# Patient Record
Sex: Male | Born: 1960
Health system: Southern US, Community
[De-identification: ages and names within clinical notes are randomized; demographics above are authoritative.]

## PROBLEM LIST (undated history)

## (undated) DIAGNOSIS — H9193 Unspecified hearing loss, bilateral: Secondary | ICD-10-CM

## (undated) DIAGNOSIS — G809 Cerebral palsy, unspecified: Secondary | ICD-10-CM

## (undated) DIAGNOSIS — M1990 Unspecified osteoarthritis, unspecified site: Secondary | ICD-10-CM

## (undated) DIAGNOSIS — I10 Essential (primary) hypertension: Secondary | ICD-10-CM

## (undated) DIAGNOSIS — M2040 Other hammer toe(s) (acquired), unspecified foot: Secondary | ICD-10-CM

## (undated) DIAGNOSIS — K219 Gastro-esophageal reflux disease without esophagitis: Secondary | ICD-10-CM

## (undated) DIAGNOSIS — M25462 Effusion, left knee: Secondary | ICD-10-CM

## (undated) HISTORY — DX: Unspecified osteoarthritis, unspecified site: M19.90

## (undated) HISTORY — PX: CIRCUMCISION: SUR203

## (undated) HISTORY — DX: Gastro-esophageal reflux disease without esophagitis: K21.9

## (undated) HISTORY — DX: Effusion, left knee: M25.462

## (undated) HISTORY — DX: Other hammer toe(s) (acquired), unspecified foot: M20.40

## (undated) HISTORY — DX: Cerebral palsy, unspecified: G80.9

## (undated) HISTORY — DX: Essential (primary) hypertension: I10

## (undated) HISTORY — PX: FOOT SURGERY: SHX648

## (undated) HISTORY — DX: Unspecified hearing loss, bilateral: H91.93

---

## 2005-01-26 ENCOUNTER — Ambulatory Visit: Payer: Self-pay | Admitting: Internal Medicine

## 2005-07-16 ENCOUNTER — Ambulatory Visit: Payer: Self-pay | Admitting: Internal Medicine

## 2005-12-01 ENCOUNTER — Ambulatory Visit: Payer: Self-pay | Admitting: Nurse Practitioner

## 2006-06-07 ENCOUNTER — Ambulatory Visit: Payer: Self-pay | Admitting: Internal Medicine

## 2006-10-12 ENCOUNTER — Ambulatory Visit: Payer: Self-pay | Admitting: Internal Medicine

## 2006-11-16 ENCOUNTER — Ambulatory Visit: Payer: Self-pay | Admitting: Internal Medicine

## 2007-09-14 ENCOUNTER — Ambulatory Visit: Payer: Self-pay | Admitting: Internal Medicine

## 2007-09-29 ENCOUNTER — Emergency Department (HOSPITAL_COMMUNITY): Admission: EM | Admit: 2007-09-29 | Discharge: 2007-09-29 | Payer: Self-pay | Admitting: Family Medicine

## 2007-10-21 ENCOUNTER — Ambulatory Visit: Payer: Self-pay | Admitting: Internal Medicine

## 2007-11-10 ENCOUNTER — Ambulatory Visit: Payer: Self-pay | Admitting: Internal Medicine

## 2008-04-05 ENCOUNTER — Ambulatory Visit: Payer: Self-pay | Admitting: Internal Medicine

## 2008-04-05 LAB — CONVERTED CEMR LAB
Basophils Relative: 0 % (ref 0–1)
Calcium: 9.6 mg/dL (ref 8.4–10.5)
Chloride: 103 meq/L (ref 96–112)
Cholesterol: 204 mg/dL — ABNORMAL HIGH (ref 0–200)
Creatinine, Ser: 0.8 mg/dL (ref 0.40–1.50)
Glucose, Bld: 76 mg/dL (ref 70–99)
HCT: 49.4 % (ref 39.0–52.0)
HDL: 47 mg/dL (ref >39)
MCHC: 33.4 g/dL (ref 30.0–36.0)
MCV: 96.1 fL (ref 78.0–100.0)
Monocytes Relative: 7 % (ref 3–12)
Neutro Abs: 8.5 10*3/uL — ABNORMAL HIGH (ref 1.7–7.7)
Neutrophils Relative %: 65 % (ref 43–77)
Platelets: 259 10*3/uL (ref 150–400)
Potassium: 3.8 meq/L (ref 3.5–5.3)
Total CHOL/HDL Ratio: 4.3
VLDL: 38 mg/dL (ref 0–40)

## 2009-04-25 ENCOUNTER — Ambulatory Visit: Payer: Self-pay | Admitting: Internal Medicine

## 2009-04-25 LAB — CONVERTED CEMR LAB
ALT: 19 units/L (ref 0–53)
AST: 18 units/L (ref 0–37)
Albumin: 4.5 g/dL (ref 3.5–5.2)
Alkaline Phosphatase: 83 units/L (ref 39–117)
Calcium: 9.9 mg/dL (ref 8.4–10.5)
Cholesterol: 200 mg/dL (ref 0–200)
Glucose, Bld: 82 mg/dL (ref 70–99)
Potassium: 4 meq/L (ref 3.5–5.3)
Triglycerides: 118 mg/dL (ref <150)

## 2009-09-24 ENCOUNTER — Ambulatory Visit: Payer: Self-pay | Admitting: Family Medicine

## 2010-01-28 ENCOUNTER — Ambulatory Visit: Payer: Self-pay | Admitting: Family Medicine

## 2010-02-25 ENCOUNTER — Ambulatory Visit: Payer: Self-pay | Admitting: Internal Medicine

## 2010-02-25 ENCOUNTER — Ambulatory Visit (HOSPITAL_COMMUNITY): Admission: RE | Admit: 2010-02-25 | Discharge: 2010-02-25 | Payer: Self-pay | Admitting: Internal Medicine

## 2010-02-25 LAB — CONVERTED CEMR LAB
AST: 20 units/L (ref 0–37)
Albumin: 4.3 g/dL (ref 3.5–5.2)
BUN: 16 mg/dL (ref 6–23)
CO2: 24 meq/L (ref 19–32)
Chloride: 103 meq/L (ref 96–112)
Creatinine, Ser: 0.9 mg/dL (ref 0.40–1.50)
PSA: 1.9 ng/mL (ref 0.10–4.00)
Total Bilirubin: 1 mg/dL (ref 0.3–1.2)
Total Protein: 7.5 g/dL (ref 6.0–8.3)

## 2010-03-24 ENCOUNTER — Ambulatory Visit: Payer: Self-pay | Admitting: Internal Medicine

## 2011-04-21 ENCOUNTER — Ambulatory Visit (HOSPITAL_COMMUNITY)
Admission: RE | Admit: 2011-04-21 | Discharge: 2011-04-21 | Disposition: A | Discharge status: Home / Self Care | Payer: Medicaid Other | Source: Ambulatory Visit | Attending: Family Medicine | Admitting: Family Medicine

## 2011-04-21 ENCOUNTER — Other Ambulatory Visit (HOSPITAL_COMMUNITY): Payer: Self-pay | Admitting: Family Medicine

## 2011-04-21 DIAGNOSIS — M25469 Effusion, unspecified knee: Secondary | ICD-10-CM

## 2011-04-21 DIAGNOSIS — M25569 Pain in unspecified knee: Secondary | ICD-10-CM | POA: Insufficient documentation

## 2011-04-21 DIAGNOSIS — R609 Edema, unspecified: Secondary | ICD-10-CM | POA: Insufficient documentation

## 2011-05-01 HISTORY — PX: COLONOSCOPY: SHX174

## 2011-05-05 ENCOUNTER — Ambulatory Visit (AMBULATORY_SURGERY_CENTER): Payer: Medicaid Other

## 2011-05-05 VITALS — Ht 71.0 in | Wt 190.2 lb

## 2011-05-05 DIAGNOSIS — Z1211 Encounter for screening for malignant neoplasm of colon: Secondary | ICD-10-CM

## 2011-05-05 MED ORDER — PEG-KCL-NACL-NASULF-NA ASC-C 100 G PO SOLR: 1.0000 | Freq: Once | ORAL | Status: AC

## 2011-05-19 ENCOUNTER — Encounter: Payer: Self-pay | Admitting: Internal Medicine

## 2011-05-19 ENCOUNTER — Ambulatory Visit (AMBULATORY_SURGERY_CENTER): Payer: Medicaid Other | Admitting: Internal Medicine

## 2011-05-19 VITALS — BP 128/65 | HR 97 | Temp 98.3°F | Resp 23 | Ht 70.0 in | Wt 193.0 lb

## 2011-05-19 DIAGNOSIS — D126 Benign neoplasm of colon, unspecified: Secondary | ICD-10-CM

## 2011-05-19 DIAGNOSIS — K5730 Diverticulosis of large intestine without perforation or abscess without bleeding: Secondary | ICD-10-CM

## 2011-05-19 DIAGNOSIS — K5289 Other specified noninfective gastroenteritis and colitis: Secondary | ICD-10-CM

## 2011-05-19 DIAGNOSIS — Z1211 Encounter for screening for malignant neoplasm of colon: Secondary | ICD-10-CM

## 2011-05-19 MED ORDER — SODIUM CHLORIDE 0.9 % IV SOLN: 500.0000 mL | INTRAVENOUS | Status: DC

## 2011-05-19 NOTE — Patient Instructions (Signed)
Please review discharge instructions Take 1-2 Teaspoons of Metamucil daily

## 2011-05-19 NOTE — Progress Notes (Signed)
Pt tolerated the exam very well. MAW  Hung second bag of ns . 13:50 MAW

## 2011-05-20 ENCOUNTER — Telehealth: Payer: Self-pay | Admitting: *Deleted

## 2011-05-20 NOTE — Telephone Encounter (Signed)

## 2011-05-25 ENCOUNTER — Encounter: Payer: Self-pay | Admitting: Internal Medicine

## 2011-06-01 ENCOUNTER — Encounter: Payer: Self-pay | Admitting: *Deleted

## 2011-07-01 ENCOUNTER — Ambulatory Visit: Payer: Medicare Other | Attending: Family Medicine | Admitting: Physical Therapy

## 2011-07-01 DIAGNOSIS — R293 Abnormal posture: Secondary | ICD-10-CM | POA: Insufficient documentation

## 2011-07-01 DIAGNOSIS — R269 Unspecified abnormalities of gait and mobility: Secondary | ICD-10-CM | POA: Insufficient documentation

## 2011-07-01 DIAGNOSIS — M25569 Pain in unspecified knee: Secondary | ICD-10-CM | POA: Insufficient documentation

## 2011-07-01 DIAGNOSIS — IMO0001 Reserved for inherently not codable concepts without codable children: Secondary | ICD-10-CM | POA: Insufficient documentation

## 2011-07-14 ENCOUNTER — Ambulatory Visit: Payer: Medicare Other | Admitting: Physical Therapy

## 2011-07-16 ENCOUNTER — Ambulatory Visit: Payer: Medicare Other | Admitting: Physical Therapy

## 2011-07-21 ENCOUNTER — Ambulatory Visit: Payer: Medicare Other | Admitting: Physical Therapy

## 2011-07-23 ENCOUNTER — Encounter: Payer: Medicare Other | Admitting: Physical Therapy

## 2011-07-30 ENCOUNTER — Ambulatory Visit: Payer: Medicare Other | Admitting: Physical Therapy

## 2013-03-14 ENCOUNTER — Emergency Department (HOSPITAL_COMMUNITY)
Admission: EM | Admit: 2013-03-14 | Discharge: 2013-03-14 | Disposition: A | Discharge status: Home / Self Care | Payer: Medicare Other | Attending: Emergency Medicine | Admitting: Emergency Medicine

## 2013-03-14 ENCOUNTER — Encounter (HOSPITAL_COMMUNITY): Payer: Self-pay | Admitting: *Deleted

## 2013-03-14 DIAGNOSIS — R112 Nausea with vomiting, unspecified: Secondary | ICD-10-CM | POA: Insufficient documentation

## 2013-03-14 DIAGNOSIS — M129 Arthropathy, unspecified: Secondary | ICD-10-CM | POA: Insufficient documentation

## 2013-03-14 DIAGNOSIS — F17200 Nicotine dependence, unspecified, uncomplicated: Secondary | ICD-10-CM | POA: Insufficient documentation

## 2013-03-14 DIAGNOSIS — R1110 Vomiting, unspecified: Secondary | ICD-10-CM

## 2013-03-14 DIAGNOSIS — Z8669 Personal history of other diseases of the nervous system and sense organs: Secondary | ICD-10-CM | POA: Insufficient documentation

## 2013-03-14 DIAGNOSIS — Z8739 Personal history of other diseases of the musculoskeletal system and connective tissue: Secondary | ICD-10-CM | POA: Insufficient documentation

## 2013-03-14 DIAGNOSIS — H9190 Unspecified hearing loss, unspecified ear: Secondary | ICD-10-CM | POA: Insufficient documentation

## 2013-03-14 DIAGNOSIS — I10 Essential (primary) hypertension: Secondary | ICD-10-CM | POA: Insufficient documentation

## 2013-03-14 DIAGNOSIS — R197 Diarrhea, unspecified: Secondary | ICD-10-CM | POA: Insufficient documentation

## 2013-03-14 DIAGNOSIS — Z79899 Other long term (current) drug therapy: Secondary | ICD-10-CM | POA: Insufficient documentation

## 2013-03-14 LAB — CBC WITH DIFFERENTIAL/PLATELET
Basophils Absolute: 0 10*3/uL (ref 0.0–0.1)
Basophils Relative: 0 % (ref 0–1)
Eosinophils Absolute: 0.2 10*3/uL (ref 0.0–0.7)
HCT: 47.8 % (ref 39.0–52.0)
Monocytes Absolute: 1.2 10*3/uL — ABNORMAL HIGH (ref 0.1–1.0)
Neutro Abs: 5 10*3/uL (ref 1.7–7.7)
Neutrophils Relative %: 54 % (ref 43–77)
Platelets: 219 10*3/uL (ref 150–400)
RBC: 5.11 MIL/uL (ref 4.22–5.81)
RDW: 12.7 % (ref 11.5–15.5)
WBC: 9.2 10*3/uL (ref 4.0–10.5)

## 2013-03-14 LAB — COMPREHENSIVE METABOLIC PANEL
AST: 28 U/L (ref 0–37)
BUN: 12 mg/dL (ref 6–23)
CO2: 27 mEq/L (ref 19–32)
GFR calc Af Amer: >90 mL/min (ref >90)
Glucose, Bld: 88 mg/dL (ref 70–99)
Sodium: 139 mEq/L (ref 135–145)

## 2013-03-14 MED ORDER — SODIUM CHLORIDE 0.9 % IV BOLUS (SEPSIS)
1000.0000 mL | Freq: Once | INTRAVENOUS | Status: AC
  Administered 2013-03-14: 1000 mL via INTRAVENOUS

## 2013-03-14 MED ORDER — ONDANSETRON HCL 4 MG/2ML IJ SOLN
4.0000 mg | Freq: Once | INTRAMUSCULAR | Status: AC
  Administered 2013-03-14: 4 mg via INTRAVENOUS
  Filled 2013-03-14: qty 2

## 2013-03-14 NOTE — ED Notes (Signed)
The pt has had vomiting and lower back pain for 3 days with intermittent diarrhea

## 2013-03-14 NOTE — ED Provider Notes (Signed)
I saw and evaluated the patient, reviewed the resident's note and I agree with the findings and plan.  No vomiting today but vomited the prior 2 days, he has had loose stools several times a day for the last 2 days and a few loose stools today nonbloody, he has had intermittent mild migratory abdominal pain but no abdominal pain or tenderness now in ED.  Hurman Horn, MD 03/17/13 [phone]

## 2013-03-14 NOTE — ED Provider Notes (Signed)
History     CSN: [id number]  Arrival date & time 03/14/13  1927   First MD Initiated Contact with Patient 03/14/13 1948      Chief Complaint  Patient presents with  . Emesis    (Consider location/radiation/quality/duration/timing/severity/associated sxs/prior treatment) Patient is a 52 y.o. male presenting with vomiting. The history is provided by the patient.  Emesis Severity:  Mild Duration:  2 days Timing:  Intermittent Number of daily episodes:  2 Quality:  Stomach contents Able to tolerate:  Liquids Progression:  Improving Chronicity:  New Recent urination:  Decreased Context: not post-tussive and not self-induced   Associated symptoms: diarrhea   Associated symptoms: no abdominal pain, no chills and no headaches   Risk factors: suspect food intake   Risk factors: no alcohol use and no sick contacts     Past Medical History  Diagnosis Date  . Hypertension   . Hammer toe     right  . Hearing loss of both ears     Pt has bilateral hearing aids  . Swollen L knee   . Arthritis   . Cerebral palsy     Past Surgical History  Procedure Laterality Date  . Foot surgery      due to CP    Family History  Problem Relation Age of Onset  . Prostate cancer Father     History  Substance Use Topics  . Smoking status: Current Every Day Smoker -- 1.00 packs/day    Types: Cigarettes  . Smokeless tobacco: Never Used  . Alcohol Use: 1.5 oz/week    3 drink(s) per week      Review of Systems  Constitutional: Negative for fever, chills, diaphoresis, activity change and appetite change.  HENT: Negative for neck pain.   Respiratory: Negative for cough, chest tightness, shortness of breath and wheezing.   Cardiovascular: Negative for chest pain and palpitations.  Gastrointestinal: Positive for nausea, vomiting and diarrhea. Negative for abdominal pain and constipation.  Genitourinary: Negative for frequency and difficulty urinating.  Skin: Negative for rash and  wound.  Neurological: Negative for dizziness, syncope, weakness, light-headedness, numbness and headaches.  All other systems reviewed and are negative.    Allergies  Review of patient's allergies indicates no known allergies.  Home Medications   Current Outpatient Rx  Name  Route  Sig  Dispense  Refill  . hydrochlorothiazide 25 MG tablet   Oral   Take 25 mg by mouth daily.           Marland Kitchen lisinopril (PRINIVIL,ZESTRIL) 20 MG tablet   Oral   Take 20 mg by mouth daily.           . naproxen (NAPROSYN) 500 MG tablet   Oral   Take 500 mg by mouth 2 (two) times daily with a meal.           . omeprazole (PRILOSEC) 20 MG capsule   Oral   Take 20 mg by mouth daily. Take 2 capsules daily            BP 141/87  Pulse 99  Temp(Src) 98.6 F (37 C) (Oral)  Resp 18  SpO2 98%  Physical Exam  Nursing note and vitals reviewed. Constitutional: He appears well-developed and well-nourished.  HENT:  Head: Normocephalic and atraumatic.  Right Ear: External ear normal.  Left Ear: External ear normal.  Nose: Nose normal.  Mouth/Throat: Oropharynx is clear and moist. No oropharyngeal exudate.  Eyes: Conjunctivae are normal. Pupils are equal, round, and reactive  to light.  Neck: Normal range of motion. Neck supple.  Cardiovascular: Normal rate, regular rhythm, normal heart sounds and intact distal pulses.   Pulmonary/Chest: Effort normal and breath sounds normal. No respiratory distress. He has no wheezes. He has no rales. He exhibits no tenderness.  Abdominal: Soft. Bowel sounds are normal. He exhibits no distension and no mass. There is no tenderness. There is no rebound and no guarding.  Musculoskeletal: Normal range of motion. He exhibits no edema and no tenderness.  Neurological: He is alert. He displays normal reflexes. No cranial nerve deficit. He exhibits normal muscle tone. Coordination normal.  Skin: Skin is warm and dry. No rash noted. No erythema. No pallor.  Psychiatric:  He has a normal mood and affect. His behavior is normal. Judgment and thought content normal.    ED Course  Procedures (including critical care time)  Labs Reviewed  CBC WITH DIFFERENTIAL - Abnormal; Notable for the following:    Monocytes Relative 13 (*)    Monocytes Absolute 1.2 (*)    All other components within normal limits  COMPREHENSIVE METABOLIC PANEL  LIPASE, BLOOD  URINALYSIS, ROUTINE W REFLEX MICROSCOPIC  URINALYSIS, ROUTINE W REFLEX MICROSCOPIC   No results found.   1. Vomiting   2. Diarrhea       MDM  53 yo M presents for 2 days of non-bloody/non-bilious emesis and non-bloody diarrhea with occasional migratory cramping abdominal pain. AFVSS. Abdomen soft and non-tender on palpation. Clinical picture not concerning for testicular torsion, acute appendicitis, acute cholecystitis, acute pancreatitis, AAA, or obstruction. No clinical evidence of dehydration. Pt tolerated PO intake in ED and counseled on BRAT diet. Patient given return precautions, including worsening of signs or symptoms. Patient instructed to follow-up with primary care physician.           Clemetine Marker, MD 03/14/13 [phone]

## 2013-03-14 NOTE — ED Notes (Signed)
Patient given discharge instructions for vomiting and diarrhea. No rx was provided. Advised to follow up with primary care in two days if not feeling better, went over [employer]. Patient voiced understanding of all instructions and had no further questions. Patient ambulated to front lobby without difficulty

## 2013-03-14 NOTE — ED Notes (Signed)
Patient states he began vomiting on Sunday, having nausea, some diarrhea. States no vomiting or diarrhea since yesterday. Also experiencing lower back pain, began on Sunday. No appetite. Noticed that his urine is dark in color, denies any trouble with urination. Plan of care discussed with patient. Call light at bedside. Will continue to monitor.

## 2013-03-14 NOTE — ED Notes (Signed)
Patient given urine cup, states he is unable to urinate at this time. Asking for water, informed patient we are going to wait until the doctor sees patient.

## 2013-03-17 ENCOUNTER — Ambulatory Visit: Payer: Medicare Other

## 2013-03-17 ENCOUNTER — Ambulatory Visit (INDEPENDENT_AMBULATORY_CARE_PROVIDER_SITE_OTHER): Payer: Medicare Other | Admitting: Family Medicine

## 2013-03-17 VITALS — BP 110/70 | HR 86 | Temp 99.0°F | Resp 16 | Ht 68.0 in | Wt 182.0 lb

## 2013-03-17 DIAGNOSIS — R109 Unspecified abdominal pain: Secondary | ICD-10-CM

## 2013-03-17 DIAGNOSIS — K5289 Other specified noninfective gastroenteritis and colitis: Secondary | ICD-10-CM

## 2013-03-17 DIAGNOSIS — R300 Dysuria: Secondary | ICD-10-CM

## 2013-03-17 DIAGNOSIS — E860 Dehydration: Secondary | ICD-10-CM

## 2013-03-17 LAB — COMPREHENSIVE METABOLIC PANEL
ALT: 38 U/L (ref 0–53)
Alkaline Phosphatase: 94 U/L (ref 39–117)
CO2: 26 mEq/L (ref 19–32)
Glucose, Bld: 83 mg/dL (ref 70–99)
Total Bilirubin: 1.6 mg/dL — ABNORMAL HIGH (ref 0.3–1.2)

## 2013-03-17 LAB — AMYLASE: Amylase: 53 U/L (ref 0–105)

## 2013-03-17 LAB — POCT CBC
HCT, POC: 49.9 % (ref 43.5–53.7)
Hemoglobin: 16 g/dL (ref 14.1–18.1)
Lymph, poc: 3.2 (ref 0.6–3.4)
MCHC: 32.1 g/dL (ref 31.8–35.4)
POC Granulocyte: 7.6 — AB (ref 2–6.9)
POC LYMPH PERCENT: 27.4 %L (ref 10–50)
POC MID %: 6.9 %M (ref 0–12)
Platelet Count, POC: 260 10*3/uL (ref 142–424)
RDW, POC: 13 %

## 2013-03-17 LAB — POCT URINALYSIS DIPSTICK
Blood, UA: NEGATIVE
Glucose, UA: NEGATIVE
Nitrite, UA: NEGATIVE
Protein, UA: NEGATIVE
Urobilinogen, UA: 2

## 2013-03-17 LAB — POCT UA - MICROSCOPIC ONLY
Casts, Ur, LPF, POC: NEGATIVE
Crystals, Ur, HPF, POC: NEGATIVE
Yeast, UA: NEGATIVE

## 2013-03-17 MED ORDER — ONDANSETRON 8 MG PO TBDP: 8.0000 mg | ORAL_TABLET | Freq: Three times a day (TID) | ORAL | Status: DC | PRN

## 2013-03-17 NOTE — Patient Instructions (Addendum)
1. HOLD HYDROCHLORATHIAZIDE (HCTZ) FOR THE NEXT 48 HOURS. 2. RETURN FOR WORSENING ABDOMINAL PAIN, PERSISTENT VOMITING. 3. CALL OFFICE ON Monday IF NO IMPROVEMENT; WE WILL SCHEDULE GASTRIC EMPTYING STUDY.

## 2013-03-17 NOTE — Progress Notes (Signed)
[address]   [place], [place]  [address]   [phone]  Subjective:    Patient ID: Dustin Mora, male    DOB: December 27, 1960, 52 y.o.   MRN: [id number]  HPI This 52 y.o. male presents for evaluation of vomiting, diarrhea.  Onset five days ago.  Excessive belching, dark urine for entire week. Unable to keep much down on stomach.  First day of vomiting, had taken a Naproxen 20 hours earlier, the Naproxen came up whole.  Yesterday, vomited again; had taken pills the day before, everything he head eaten came up and pill came up again intact.  Concerned that food and pills not being absorbed.  Nausea, vomiting for five days.  Griping in stomach.  Had severe reflux episode with onset; takes Omeprazole daily.  Did not take Omeprazole or Naproxen today; only took blood pressure.  No pain but does have urge to urinate.  Did urinate this morning and during middle of night.   No fever.  Slight headache during the week.  This morning, suprapubic region was painful. Had upper back pain this morning.  Feels better today than all week.  But has not eaten anything today.  Last food intake was fruit cup, raisin cake last night at 6:00pm.   Did drink water this morning and now feels bloated.  No chills/sweats.  Mild nausea; administered Zofran in ED but no rx.  Belching excessively but now better.  Vomiting x twice daily on average; vomited x 1 yesterday; vomited last night at 6:00pm; food contents and pills.  Ate sausage with rice yesterday because hungry; ate this meal at 10:00am.  Felt bloated and nauseated all day.  Minimal flatus.  Had bowel movement yesterday watery; non-bloody; no melena.  Diarrhea earlier in week.  Unable to urinate in office today.  Drank two bottles of water in office.  Also drinking water at nightstand.  Mildly lightheaded.  No similar symptoms.  No previous surgeries.  Colonoscopy UTD in 2012; repeat in ten years.  Yesterday, urination down and concentrated.  Urinated x 2 yesterday.  "I am  starving".    Evaluated in ED 72 hours ago; CBC, CMET, Lipase all negative.     Review of Systems  Constitutional: Positive for fatigue. Negative for fever, chills and diaphoresis.  Gastrointestinal: Positive for nausea, vomiting and diarrhea. Negative for abdominal pain, constipation, blood in stool, abdominal distention, anal bleeding and rectal pain.  Neurological: Positive for light-headedness and headaches. Negative for dizziness.        Past Medical History  Diagnosis Date  . Hypertension   . Hammer toe     right  . Hearing loss of both ears     Pt has bilateral hearing aids  . Swollen L knee   . Arthritis   . Cerebral palsy     Past Surgical History  Procedure Laterality Date  . Foot surgery      due to CP  . Colonoscopy  05/01/2011    diverticulosis.  Brodie.    Prior to Admission medications   Medication Sig Start Date End Date Taking? Authorizing Provider  hydrochlorothiazide 25 MG tablet Take 25 mg by mouth daily.     Yes Historical Provider, MD  lisinopril (PRINIVIL,ZESTRIL) 20 MG tablet Take 20 mg by mouth daily.     Yes Historical Provider, MD  naproxen (NAPROSYN) 500 MG tablet Take 500 mg by mouth 2 (two) times daily with a meal.     Yes Historical Provider, MD  omeprazole (PRILOSEC) 20 MG capsule Take 40 mg by mouth daily.    Yes Historical Provider, MD    No Known Allergies  History   Social History  . Marital Status: Single    Spouse Name: N/A    Number of Children: N/A  . Years of Education: N/A   Occupational History  . Not on file.   Social History Main Topics  . Smoking status: Current Every Day Smoker -- 0.50 packs/day for 15 years    Types: Cigarettes  . Smokeless tobacco: Never Used  . Alcohol Use: 1.5 oz/week    3 drink(s) per week  . Drug Use: No  . Sexually Active: Not Currently   Other Topics Concern  . Not on file   Social History Narrative  . No narrative on file    Family History  Problem Relation Age of Onset  .  Prostate cancer Father     Objective:   Physical Exam  Nursing note and vitals reviewed. Constitutional: He is oriented to person, place, and time. He appears well-developed and well-nourished. No distress.  HENT:  Mouth/Throat: Oropharynx is clear and moist.  Cardiovascular: Normal rate, regular rhythm and normal heart sounds.   No murmur heard. Pulmonary/Chest: Effort normal. He has no wheezes. He has no rales.  Abdominal: Soft. Bowel sounds are normal. He exhibits no distension and no mass. There is no tenderness. There is no rebound and no guarding.  Neurological: He is alert and oriented to person, place, and time.  Skin: Skin is warm and dry. No rash noted. He is not diaphoretic.  Psychiatric: He has a normal mood and affect. His behavior is normal.   128/80 supine,  110/70 sitting,  120/70 standing.  DRANK 48 OUNCES OF WATER DURING VISIT.    Results for orders placed in visit on 03/17/13  POCT CBC      Result Value Range   WBC 11.6 (*) 4.6 - 10.2 K/uL   Lymph, poc 3.2  0.6 - 3.4   POC LYMPH PERCENT 27.4  10 - 50 %L   MID (cbc) 0.8  0 - 0.9   POC MID % 6.9  0 - 12 %M   POC Granulocyte 7.6 (*) 2 - 6.9   Granulocyte percent 65.7  37 - 80 %G   RBC 5.03  4.69 - 6.13 M/uL   Hemoglobin 16.0  14.1 - 18.1 g/dL   HCT, POC 40.9  81.1 - 53.7 %   MCV 99.2 (*) 80 - 97 fL   MCH, POC 31.8 (*) 27 - 31.2 pg   MCHC 32.1  31.8 - 35.4 g/dL   RDW, POC 91.4     Platelet Count, POC 260  142 - 424 K/uL   MPV 9.5  0 - 99.8 fL   [facility] reading (PRIMARY) by  Dr. Katrinka Blazing.  ABDOMINAL TWO VIEW:  SCATTERED AIR FLUID LEVELS; ILEUS?  Procedure: iv placed; 2 liters NS administered in office.   Assessment & Plan:  Abdominal pain - Plan: POCT urinalysis dipstick, POCT UA - Microscopic Only, DG Abd 2 Views  Other and unspecified noninfectious gastroenteritis and colitis - Plan: POCT CBC, Comprehensive metabolic panel, Lipase, Amylase, DG Abd 2 Views   1.  Abdominal pain:  New.  Benign abdominal exam  in office. Associated with vomiting, diarrhea.  Abdominal series with ileus versus early bowel obstruction; symptoms currently mild yet persistent for 5 days; continue to observe over the next 72 hours.   2.  Nausea with vomiting  and diarrhea:  New.  Ddx includes gastroenteritis versus gastric outlet obstruction versus gastroparesis.  If no improvement in upcoming 72 hours, refer to GI and schedule gastric emptying study.  BRAT diet, hydrate.  Rx for Zofran.  RTC for persistent symptoms or acute worsening.   3.  Dysuria:  New.  Send urine culture.   4.  Dehydration:  New.  S/p 2 liters ivf in office.  BRAT diet, hydration.  HOLD HCTZ for next 48 hours. 5.  HTN: stable; hold HCTZ for 48 hours.  Meds ordered this encounter  Medications  . ondansetron (ZOFRAN-ODT) 8 MG disintegrating tablet    Sig: Take 1 tablet (8 mg total) by mouth every 8 (eight) hours as needed for nausea.    Dispense:  30 tablet    Refill:  0

## 2013-03-19 LAB — URINE CULTURE
Colony Count: NO GROWTH
Organism ID, Bacteria: NO GROWTH

## 2013-03-21 ENCOUNTER — Ambulatory Visit (HOSPITAL_COMMUNITY)
Admission: RE | Admit: 2013-03-21 | Discharge: 2013-03-21 | Disposition: A | Discharge status: Home / Self Care | Payer: Medicare Other | Source: Ambulatory Visit | Attending: Family Medicine | Admitting: Family Medicine

## 2013-03-21 ENCOUNTER — Ambulatory Visit (INDEPENDENT_AMBULATORY_CARE_PROVIDER_SITE_OTHER): Payer: Medicare Other | Admitting: Family Medicine

## 2013-03-21 ENCOUNTER — Encounter (HOSPITAL_COMMUNITY): Payer: Self-pay

## 2013-03-21 ENCOUNTER — Ambulatory Visit: Payer: Medicare Other

## 2013-03-21 ENCOUNTER — Other Ambulatory Visit: Payer: Self-pay | Admitting: Radiology

## 2013-03-21 VITALS — BP 126/80 | HR 75 | Temp 99.5°F | Resp 16 | Ht 68.0 in | Wt 186.2 lb

## 2013-03-21 DIAGNOSIS — Q619 Cystic kidney disease, unspecified: Secondary | ICD-10-CM | POA: Insufficient documentation

## 2013-03-21 DIAGNOSIS — R109 Unspecified abdominal pain: Secondary | ICD-10-CM

## 2013-03-21 DIAGNOSIS — L723 Sebaceous cyst: Secondary | ICD-10-CM

## 2013-03-21 DIAGNOSIS — D72829 Elevated white blood cell count, unspecified: Secondary | ICD-10-CM | POA: Insufficient documentation

## 2013-03-21 DIAGNOSIS — K5900 Constipation, unspecified: Secondary | ICD-10-CM | POA: Insufficient documentation

## 2013-03-21 DIAGNOSIS — N400 Enlarged prostate without lower urinary tract symptoms: Secondary | ICD-10-CM | POA: Insufficient documentation

## 2013-03-21 DIAGNOSIS — K429 Umbilical hernia without obstruction or gangrene: Secondary | ICD-10-CM | POA: Insufficient documentation

## 2013-03-21 DIAGNOSIS — R112 Nausea with vomiting, unspecified: Secondary | ICD-10-CM

## 2013-03-21 LAB — POCT CBC
Granulocyte percent: 66.7 %G (ref 37–80)
MCV: 98.8 fL — AB (ref 80–97)
MID (cbc): 1 — AB (ref 0–0.9)
MPV: 8.6 fL (ref 0–99.8)
POC Granulocyte: 8.3 — AB (ref 2–6.9)
Platelet Count, POC: 331 10*3/uL (ref 142–424)
RBC: 4.89 M/uL (ref 4.69–6.13)
RDW, POC: 12.6 %

## 2013-03-21 MED ORDER — IOHEXOL 300 MG/ML  SOLN
100.0000 mL | Freq: Once | INTRAMUSCULAR | Status: AC | PRN
  Administered 2013-03-21: 100 mL via INTRAVENOUS

## 2013-03-21 NOTE — Patient Instructions (Addendum)
You can go for your CT scan today at [facility]. Drink bottle #1 now then bottle #2 at 5pm arrive in radiology at 5:30 for the scan.  Driving directions to [facility] [address]  [phone]  - more info    [address]  [place], [place] [address]     1. Head south on [address] toward [address]      0.5 mi    2. Sharp left onto [address]      0.6 mi    3. Turn left onto the [address] ramp      0.2 mi    4. Merge onto [address]      3.0 mi    5. Continue straight to stay on [address]      0.4 mi    6. Slight left to stay on [address]      1.2 mi    7. Turn right onto [address]      0.1 mi    8. Turn left onto [address]      361 ft    9. Take the 1st left onto [facility]  Destination will be on the right      ,

## 2013-03-21 NOTE — Telephone Encounter (Signed)
Spoke to patient about the Rx's

## 2013-03-21 NOTE — Progress Notes (Signed)
   Patient ID: SOSAIA PITTINGER MRN: [id number], DOB: 1961-09-02, 52 y.o. Date of Encounter: 03/21/2013, 3:35 PM    PROCEDURE NOTE: Verbal consent obtained. Risks and benefits of the procedure were explained to the patient. Patient made an informed decision to proceed with the procedure. Betadine prep per usual protocol. Local anesthesia obtained with 1% lidocaine with epi 2 cc  1 cm incision made with 11 blade along lesion medial aspect of lesion while assistant retracted lateral aspect and maintained control of the important deep vascular structures.  Moderate purulence expressed. Lesion explored revealing no loculations. Irrigated with normal saline.  Packed with 1/4 inch plain packing.  Dressed. Wound care instructions including precautions with patient. Patient tolerated the procedure well. Recheck in 48 hours.      SignedEula Listen, PA-C 03/21/2013 3:35 PM

## 2013-03-21 NOTE — Progress Notes (Signed)
[facility] [address], [place] [address] [phone]  Date:  03/21/2013   Name:  Dustin Mora   DOB:  Aug 23, 1961   MRN:  [id number]  PCP:  Default, Provider, MD    Chief Complaint: lump on throat and Bloated   History of Present Illness:  Dustin Mora is a 52 y.o. very pleasant male patient who presents with the following:  He is here today mostly to evaluate a "lump" on his right chin/ throat area.  He developed an ingrown hair that seemed to get worse over the last 5 days. He shaved his usual full beard.  He has actually had some problems with this area over the years, will wax and wane. About once a year it will get larger, he will "pop" it and get some thick material out.  It will then become almost flat for a time prior to enlarging again.  Right now the area is quite large and is tender   He was also here on Friday, 03/17/13 and was treated for vomiting and diarrhea with oral and IV fluids with plan for close follow-up.  Prior to that he was in the ED on 4/15 with vomiting, diarrhea and abdominal cramps and was treated conservatively.  He continues to note bloating and gas, especially after eating. He will feel ok, but then after eating has increased cramps, pain, and a lot of gas.   He has not vomited for several days. He has not noted any more diarrhea.  He is able to eat ok now but increased gas follows He will have some intermittennt gas pains, but no other abdominal pain.    He had a colonoscopy in 2012 per Dr. Juanda Chance.  He is not aware of any history of diverticulitis.    There is no problem list on file for this patient.   Past Medical History  Diagnosis Date  . Hypertension   . Hammer toe     right  . Hearing loss of both ears     Pt has bilateral hearing aids  . Swollen L knee   . Arthritis   . Cerebral palsy     Past Surgical History  Procedure Laterality Date  . Foot surgery      due to CP  . Colonoscopy  05/01/2011   diverticulosis.  Brodie.    History  Substance Use Topics  . Smoking status: Current Every Day Smoker -- 0.50 packs/day for 15 years    Types: Cigarettes  . Smokeless tobacco: Never Used  . Alcohol Use: 1.5 oz/week    3 drink(s) per week    Family History  Problem Relation Age of Onset  . Prostate cancer Father     No Known Allergies  Medication list has been reviewed and updated.  Current Outpatient Prescriptions on File Prior to Visit  Medication Sig Dispense Refill  . hydrochlorothiazide 25 MG tablet Take 25 mg by mouth daily.        Marland Kitchen lisinopril (PRINIVIL,ZESTRIL) 20 MG tablet Take 20 mg by mouth daily.        . naproxen (NAPROSYN) 500 MG tablet Take 500 mg by mouth 2 (two) times daily with a meal.        . omeprazole (PRILOSEC) 20 MG capsule Take 40 mg by mouth daily.       . ondansetron (ZOFRAN-ODT) 8 MG disintegrating tablet Take 1 tablet (8 mg total) by mouth every 8 (eight) hours as needed for  nausea.  30 tablet  0   Current Facility-Administered Medications on File Prior to Visit  Medication Dose Route Frequency Provider Last Rate Last Dose  . 0.9 %  sodium chloride infusion  500 mL Intravenous Continuous Hart Carwin, MD        Review of Systems:  As per HPI- otherwise negative.   Physical Examination: Filed Vitals:   03/21/13 1420  BP: 126/80  Pulse: 75  Temp: 99.5 F (37.5 C)  Resp: 16   Filed Vitals:   03/21/13 1420  Height: 5\' 8"  (1.727 m)  Weight: 186 lb 3.2 oz (84.46 kg)   Body mass index is 28.32 kg/(m^2). Ideal Body Weight: Weight in (lb) to have BMI = 25: 164.1  GEN: WDWN, NAD, Non-toxic, A & O x 3 HEENT: Atraumatic, Normocephalic. Neck supple. No masses, No LAD.  Hearing aids bilaterally, no oral lesions On the right side of his under chin area there is a slightly tender, grape sized lump which may be an inflamed sebaceous cyst.  It is slightly fluctuant, mildly tender.  No heat or redness Ears and Nose: No external deformity. CV: RRR,  No M/G/R. No JVD. No thrill. No extra heart sounds. PULM: CTA B, no wheezes, crackles, rhonchi. No retractions. No resp. distress. No accessory muscle use. ABD: S,  ND, increased BS. No rebound. No HSM.  He has vague abdominal tenderness, most consistent in the LLQ EXTR: No c/c/e NEURO Normal gait.  PSYCH: Normally interactive. Conversant. Not depressed or anxious appearing.  Calm demeanor.   Please see procedure note per Eula Listen, PA-C.    Results for orders placed in visit on 03/21/13  POCT CBC      Result Value Range   WBC 12.5 (*) 4.6 - 10.2 K/uL   Lymph, poc 3.2  0.6 - 3.4   POC LYMPH PERCENT 25.6  10 - 50 %L   MID (cbc) 1.0 (*) 0 - 0.9   POC MID % 7.7  0 - 12 %M   POC Granulocyte 8.3 (*) 2 - 6.9   Granulocyte percent 66.7  37 - 80 %G   RBC 4.89  4.69 - 6.13 M/uL   Hemoglobin 15.1  14.1 - 18.1 g/dL   HCT, POC 16.1  09.6 - 53.7 %   MCV 98.8 (*) 80 - 97 fL   MCH, POC 30.9  27 - 31.2 pg   MCHC 31.3 (*) 31.8 - 35.4 g/dL   RDW, POC 04.5     Platelet Count, POC 331  142 - 424 K/uL   MPV 8.6  0 - 99.8 fL   [facility] reading (PRIMARY) by  Dr. Patsy Lager. Abdominal series:  A few scattered air- fluid levels.  Otherwise normal.    Clinical Data: Abdominal pain.  ABDOMEN - 2 VIEW  Comparison: 03/17/2013  Findings: 2 views of the abdomen and pelvis. These are presumably both supine. Nonobstructive bowel gas pattern, with gas and stool throughout the colon. No significant small bowel dilatation. The first image is motion degraded.  S-shaped thoracolumbar spine curvature. Distal gas and stool. Right pelvic calcification which is likely a phlebolith. No abnormal abdominal calcifications. No appendicolith.  IMPRESSION: No acute findings.  Assessment and Plan: Nausea with vomiting - Plan: POCT CBC  Abdominal  pain, other specified site - Plan: DG Abd 2 Views, CT Abdomen Pelvis W Contrast  This is Dustin Mora's third visit for abdominal pain.  Although his exam is relatively benign his  white blood count is higher than on  Friday, and his temperature is elevated.  Diverticulitis is a concern. Discussed R/B/A of CT scan and he would like to proceed.   Follow-up pending CT result.  Discussed using abx for his chin infection. However, as he is now getting over a diarrheal illness and the area has been drained will defer.    Signed Abbe Amsterdam, MD  CT ABDOMEN AND PELVIS WITH CONTRAST  Technique: Multidetector CT imaging of the abdomen and pelvis was performed following the standard protocol during bolus administration of intravenous contrast.  Contrast: OMNIPAQUE IOHEXOL 300 MG/ML SOLN  Comparison: None  Findings: The lung bases are clear.  The liver, gallbladder, spleen, adrenal glands, pancreas and kidneys are unremarkable except for a small left renal cyst. No free fluid, enlarged lymph nodes, biliary dilation or abdominal aortic aneurysm identified.  The bowel, bladder and appendix are unremarkable. A small umbilical hernia containing fat is present. There is no evidence of bowel obstruction, pneumoperitoneum or abscess.  Prostate enlargement is noted. No acute or suspicious bony abnormalities are identified.  IMPRESSION: No evidence of acute abnormality.  Prostate enlargement.   Received CT result and discussed with him. No diverticulitis.  He will continue bland, small meals.  recheck in 48 hours for chin wound- Sooner if worse.

## 2013-03-22 ENCOUNTER — Telehealth: Payer: Self-pay

## 2013-03-22 NOTE — Telephone Encounter (Signed)
Spoke to him, this is new for him, has never had a check of this. I advised him we can check for him, he has follow up planned for tomorrow. He states he is feeling about the same. Dustin Mora

## 2013-03-22 NOTE — Telephone Encounter (Signed)
Called him back- he is doing ok.  He will come in tomorrow and ask for a PSA test

## 2013-03-22 NOTE — Telephone Encounter (Signed)
Pt is returning a miss call Call back number is [phone]

## 2013-03-22 NOTE — Telephone Encounter (Signed)
Notes Recorded by Pearline Cables, MD on 03/21/2013 at 10:34 PM Please call and check on how he is doing today. He is aware of his CT results. However, I did not mention his prostate. Please let him know that his prostate was enlarged on CT. This does NOT mean cancer, but he should have his annual prostate exam and PSA if he has not done so recently.  Please send me a note when done. Thanks!

## 2013-03-22 NOTE — Telephone Encounter (Signed)
Pt is calling back he had another missed call, Call back number [phone]

## 2013-03-23 ENCOUNTER — Other Ambulatory Visit: Payer: Self-pay | Admitting: Physician Assistant

## 2013-03-23 ENCOUNTER — Ambulatory Visit (INDEPENDENT_AMBULATORY_CARE_PROVIDER_SITE_OTHER): Payer: Medicare Other | Admitting: Physician Assistant

## 2013-03-23 VITALS — BP 122/84 | HR 74 | Temp 98.1°F | Resp 18 | Ht 68.0 in | Wt 186.0 lb

## 2013-03-23 DIAGNOSIS — L0211 Cutaneous abscess of neck: Secondary | ICD-10-CM

## 2013-03-23 DIAGNOSIS — N400 Enlarged prostate without lower urinary tract symptoms: Secondary | ICD-10-CM

## 2013-03-23 DIAGNOSIS — L03221 Cellulitis of neck: Secondary | ICD-10-CM

## 2013-03-23 LAB — IFOBT (OCCULT BLOOD): IFOBT: NEGATIVE

## 2013-03-23 NOTE — Progress Notes (Signed)
   Patient ID: Dustin Mora MRN: [id number], DOB: [birth year] 52 y.o. Date of Encounter: 03/23/2013, 1:04 PM  Primary Physician: Default, Provider, MD  Chief Complaint: Wound care   See previous note  HPI: 52 y.o. male presents for wound care s/p I&D on 03/21/13 Doing well No issues or complaints Afebrile/ no chills No nausea or vomiting Not placed on abx secondary to recent GI bug No pain Daily dressing change Previous note reviewed  Requests prostate exam and PSA secondary to CT on 03/21/13. Had colonoscopy 2 years prior, normal.   Past Medical History  Diagnosis Date  . Hypertension   . Hammer toe     right  . Hearing loss of both ears     Pt has bilateral hearing aids  . Swollen L knee   . Arthritis   . Cerebral palsy      Home Meds: Prior to Admission medications   Medication Sig Start Date End Date Taking? Authorizing Provider  hydrochlorothiazide 25 MG tablet Take 25 mg by mouth daily.     Yes Historical Provider, MD  lisinopril (PRINIVIL,ZESTRIL) 20 MG tablet Take 20 mg by mouth daily.     Yes Historical Provider, MD  naproxen (NAPROSYN) 500 MG tablet Take 500 mg by mouth 2 (two) times daily with a meal.     Yes Historical Provider, MD  omeprazole (PRILOSEC) 20 MG capsule Take 40 mg by mouth daily.    Yes Historical Provider, MD  ondansetron (ZOFRAN-ODT) 8 MG disintegrating tablet Take 1 tablet (8 mg total) by mouth every 8 (eight) hours as needed for nausea. 03/17/13  Yes Ethelda Chick, MD    Allergies: No Known Allergies  ROS: Constitutional: Afebrile, no chills Cardiovascular: negative for chest pain or palpitations Dermatological: Positive for wound. Negative for erythema, pain, or warmth  GI: No nausea or vomiting   EXAM: Physical Exam: Blood pressure 122/84, pulse 74, temperature 98.1 F (36.7 C), temperature source Oral, resp. rate 18, height 5\' 8"  (1.727 m), weight 186 lb (84.369 kg)., Body mass index is 28.29 kg/(m^2). General: Well developed,  well nourished, in no acute distress. Nontoxic appearing. Head: Normocephalic, atraumatic, sclera non-icteric.  Neck: Supple. Lungs: Breathing is unlabored. Heart: Normal rate. Skin:  Warm and moist. Dressing in place. No induration, erythema, or tenderness to palpation. Rectal: Performed by Dr. Perrin Maltese, prostate 2+ and symmetrical. Non tender.  Neuro: Alert and oriented X 3. Moves all extremities spontaneously. Normal gait.  Psych:  Responds to questions appropriately with a normal affect.   PROCEDURE: Dressing removed. Packing not in place. Small amount of purulence expressed Wound bed healthy Irrigated with 1% plain lidocaine 5 cc. Repacked with 1/4 inch plain Dressing applied  Labs:  Results for orders placed in visit on 03/23/13  IFOBT (OCCULT BLOOD)      Result Value Range   IFOBT Negative     PSA pending  A/P: 52 y.o. male with improving cellulitis/abscess as above s/p I&D on 03/21/13 and enlarged prostate. 1) Abscess neck -Wound care per above -No pain -Daily dressing changes -Recheck 48 hours  2) Enlarged prostate -Incidental findings on CT scan 03/21/13 -Exam performed by Dr. Perrin Maltese -Await PSA  Signed, Eula Listen, PA-C 03/23/2013 1:04 PM

## 2013-03-24 LAB — PSA: PSA: 2.76 ng/mL (ref <=4.00)

## 2013-03-24 NOTE — Addendum Note (Signed)
Addended by: Sondra Barges on: 03/24/2013 12:27 PM   Modules accepted: Orders

## 2013-03-25 ENCOUNTER — Ambulatory Visit (INDEPENDENT_AMBULATORY_CARE_PROVIDER_SITE_OTHER): Payer: Medicare Other | Admitting: Physician Assistant

## 2013-03-25 ENCOUNTER — Encounter: Payer: Self-pay | Admitting: Physician Assistant

## 2013-03-25 VITALS — BP 120/76 | HR 83 | Temp 98.0°F | Resp 17 | Ht 69.0 in | Wt 188.0 lb

## 2013-03-25 DIAGNOSIS — G809 Cerebral palsy, unspecified: Secondary | ICD-10-CM | POA: Insufficient documentation

## 2013-03-25 DIAGNOSIS — L0211 Cutaneous abscess of neck: Secondary | ICD-10-CM

## 2013-03-25 DIAGNOSIS — H9193 Unspecified hearing loss, bilateral: Secondary | ICD-10-CM | POA: Insufficient documentation

## 2013-03-25 DIAGNOSIS — N400 Enlarged prostate without lower urinary tract symptoms: Secondary | ICD-10-CM

## 2013-03-25 NOTE — Progress Notes (Signed)
  Subjective:    Patient ID: Dustin Mora, male    DOB: Aug 12, 1961, 52 y.o.   MRN: [id number]  HPI Presents for wound care, s/p I&D of cellulitis/abscess of the RIGHT upper neck on 03/21/2013 and wound care 03/23/2013. Tolerating meds well.  Less pain. No antibiotic was given due to recent GI illness and concern that an antibiotic would further exacerbate the symptoms.  No fever, chills.  Also complains of sore throat x 2 days.  No history of allergies, but has been outside in the pollen.  Scratchy throat, cough ("The kind where you pull your back out").  No nasal congestion, sinus pressure, post-nasal drainage.  He's recently had a PSA, due to prostate enlargement noted as incidental finding on CT scan.  PSA, Free is pending.  Past medical history, surgical history, family history, social history and problem list reviewed.   Review of Systems As above.    Objective:   Physical Exam BP 120/76  Pulse 83  Temp(Src) 98 F (36.7 C) (Oral)  Resp 17  Ht 5\' 9"  (1.753 m)  Wt 188 lb (85.276 kg)  BMI 27.75 kg/m2  SpO2 93%  Well-developed, well nourished BM who is awake, alert and oriented, in NAD. HEENT: [place]/AT, PERRL, EOMI.  Sclera and conjunctiva are clear.  EAC are patent, TMs are normal in appearance. Nasal mucosa is pink and moist. OP is clear, dentition is in fair repair. Neck: supple, non-tender, no lymphadenopathy, thyromegaly. Heart: RRR, no murmur Lungs: normal effort, CTA Extremities: no cyanosis, clubbing or edema. Skin: warm and dry without rash. Wound of the RIGHT upper neck is without erythema.  Induration noted, but non-tender.  Minimal drainage is serous. Packing is no longer present and wound has begun to close such that no additional packing can be placed. Psychologic: good mood and appropriate affect, normal speech and behavior.     Assessment & Plan:  Abscess, neck - Local wound care.  Anticipatory guidance.  Enlarged prostate - await Free PSA level.   Patient  Instructions  Apply a warm compress to the area for 15-20 minutes 2-3 times daily.   Keep it covered until the drainage stops. If the drainage increases, or if you develop increased pain or swelling, please return for re-evaluation.  Get plenty of rest and drink at least 64 ounces of water daily. Use an OTC antihistamine, like Claritin, Zyrtec or Allegra. Use OTC Mucinex DM to help thin the mucous, which will make it easier to cough up and spit out, and reduce the cough.  Fernande Bras, PA-C Physician Assistant-Certified [facility]

## 2013-03-25 NOTE — Patient Instructions (Signed)
Apply a warm compress to the area for 15-20 minutes 2-3 times daily.   Keep it covered until the drainage stops. If the drainage increases, or if you develop increased pain or swelling, please return for re-evaluation.  Get plenty of rest and drink at least 64 ounces of water daily. Use an OTC antihistamine, like Claritin, Zyrtec or Allegra. Use OTC Mucinex DM to help thin the mucous, which will make it easier to cough up and spit out, and reduce the cough.

## 2013-03-29 NOTE — Addendum Note (Signed)
Addended by: Sondra Barges on: 03/29/2013 12:02 PM   Modules accepted: Orders

## 2014-02-03 ENCOUNTER — Ambulatory Visit (INDEPENDENT_AMBULATORY_CARE_PROVIDER_SITE_OTHER): Payer: Medicare Other | Admitting: Family Medicine

## 2014-02-03 VITALS — BP 128/80 | HR 78 | Temp 98.8°F | Resp 16 | Ht 68.5 in | Wt 186.2 lb

## 2014-02-03 DIAGNOSIS — J029 Acute pharyngitis, unspecified: Secondary | ICD-10-CM

## 2014-02-03 DIAGNOSIS — J069 Acute upper respiratory infection, unspecified: Secondary | ICD-10-CM

## 2014-02-03 DIAGNOSIS — B9789 Other viral agents as the cause of diseases classified elsewhere: Secondary | ICD-10-CM

## 2014-02-03 LAB — POCT RAPID STREP A (OFFICE): Rapid Strep A Screen: NEGATIVE

## 2014-02-03 MED ORDER — HYDROCODONE-HOMATROPINE 5-1.5 MG/5ML PO SYRP: 5.0000 mL | ORAL_SOLUTION | Freq: Every evening | ORAL | Status: DC | PRN

## 2014-02-03 MED ORDER — FLUTICASONE PROPIONATE 50 MCG/ACT NA SUSP
2.0000 | Freq: Every day | NASAL | Status: DC
Start: 2014-02-03 — End: 2020-08-02

## 2014-02-03 NOTE — Patient Instructions (Signed)

## 2014-02-03 NOTE — Progress Notes (Signed)
Chief Complaint:  Chief Complaint  Patient presents with  . Sore Throat    "itchy/dry"  . Cough    worse at night, keeps him awake x3 days    HPI: Dustin Mora is a 53 y.o. male who is here for itchy and scratchy throat x 1 week Sore throat, cough no nonproductive he stays up at night because of cough, no fevers chill n/v rash pr diarrhea. Denies SOB/CP No msk aches, , + flu vaccine, has tried otc med without relief  Past Medical History  Diagnosis Date  . Hypertension   . Hammer toe     right  . Hearing loss of both ears     Pt has bilateral hearing aids  . Swollen L knee   . Arthritis   . Cerebral palsy    Past Surgical History  Procedure Laterality Date  . Foot surgery      due to CP  . Colonoscopy  05/01/2011    diverticulosis.  [place]ie.   History   Social History  . Marital Status: Single    Spouse Name: N/A    Number of Children: N/A  . Years of Education: N/A   Social History Main Topics  . Smoking status: Current Every Day Smoker -- 0.50 packs/day for 15 years    Types: Cigarettes  . Smokeless tobacco: Never Used  . Alcohol Use: 1.5 oz/week    3 drink(s) per week  . Drug Use: No  . Sexual Activity: Not Currently   Other Topics Concern  . None   Social History Narrative  . None   Family History  Problem Relation Age of Onset  . Prostate cancer Father    No Known Allergies Prior to Admission medications   Medication Sig Start Date End Date Taking? Authorizing Provider  hydrochlorothiazide 25 MG tablet Take 25 mg by mouth daily.     Yes Historical Provider, MD  lisinopril (PRINIVIL,ZESTRIL) 20 MG tablet Take 20 mg by mouth daily.     Yes Historical Provider, MD  naproxen (NAPROSYN) 500 MG tablet Take 500 mg by mouth 2 (two) times daily with a meal.     Yes Historical Provider, MD  omeprazole (PRILOSEC) 40 MG capsule Take 40 mg by mouth daily.   Yes Historical Provider, MD     ROS: The patient denies fevers, chills, night sweats,  unintentional weight loss, chest pain, palpitations, wheezing, dyspnea on exertion, nausea, vomiting, abdominal pain, dysuria, hematuria, melena, numbness, weakness, or tingling.  All other systems have been reviewed and were otherwise negative with the exception of those mentioned in the HPI and as above.    PHYSICAL EXAM: Filed Vitals:   02/03/14 1648  BP: 128/80  Pulse: 78  Temp: 98.8 F (37.1 C)  Resp: 16   Filed Vitals:   02/03/14 1648  Height: 5' 8.5" (1.74 m)  Weight: 186 lb 3.2 oz (84.46 kg)   Body mass index is 27.9 kg/(m^2).  General: Alert, no acute distress HEENT:  Normocephalic, atraumatic, oropharynx patent. EOMI, PERRLA Cardiovascular:  Regular rate and rhythm, no rubs murmurs or gallops.  No Carotid bruits, radial pulse intact. No pedal edema.  Respiratory: Clear to auscultation bilaterally.  No wheezes, rales, or rhonchi.  No cyanosis, no use of accessory musculature GI: No organomegaly, abdomen is soft and non-tender, positive bowel sounds.  No masses. Skin: No rashes. Neurologic: Facial musculature symmetric. Psychiatric: Patient is appropriate throughout our interaction. Lymphatic: No cervical lymphadenopathy Musculoskeletal: Gait intact.  LABS: Results for orders placed in visit on 02/03/14  POCT RAPID STREP A (OFFICE)      Result Value Ref Range   Rapid Strep A Screen Negative  Negative     EKG/XRAY:   Primary read interpreted by Dr. Marin Comment at [facility].   ASSESSMENT/PLAN: Encounter Diagnoses  Name Primary?  . Acute pharyngitis Yes  . Viral URI with cough    Rx hycodan and flonase F/u prn  Gross sideeffects, risk and benefits, and alternatives of medications d/w patient. Patient is aware that all medications have potential sideeffects and we are unable to predict every sideeffect or drug-drug interaction that may occur.  Jawara Latorre, [place], DO 02/06/2014 8:18 AM

## 2014-07-05 ENCOUNTER — Encounter: Payer: Self-pay | Admitting: Family Medicine

## 2014-07-05 ENCOUNTER — Ambulatory Visit (INDEPENDENT_AMBULATORY_CARE_PROVIDER_SITE_OTHER): Payer: Medicare Other | Admitting: Family Medicine

## 2014-07-05 VITALS — BP 120/68 | HR 77 | Temp 98.7°F | Resp 16 | Ht 69.25 in | Wt 192.2 lb

## 2014-07-05 DIAGNOSIS — I10 Essential (primary) hypertension: Secondary | ICD-10-CM | POA: Insufficient documentation

## 2014-07-05 DIAGNOSIS — R300 Dysuria: Secondary | ICD-10-CM

## 2014-07-05 DIAGNOSIS — N410 Acute prostatitis: Secondary | ICD-10-CM

## 2014-07-05 DIAGNOSIS — Z87898 Personal history of other specified conditions: Secondary | ICD-10-CM

## 2014-07-05 DIAGNOSIS — Z87438 Personal history of other diseases of male genital organs: Secondary | ICD-10-CM

## 2014-07-05 LAB — POCT URINALYSIS DIPSTICK
Bilirubin, UA: NEGATIVE
Blood, UA: NEGATIVE
GLUCOSE UA: NEGATIVE
KETONES UA: NEGATIVE
Leukocytes, UA: NEGATIVE
Nitrite, UA: NEGATIVE
Protein, UA: NEGATIVE
Urobilinogen, UA: 0.2
pH, UA: 6

## 2014-07-05 LAB — POCT UA - MICROSCOPIC ONLY
Casts, Ur, LPF, POC: NEGATIVE
Crystals, Ur, HPF, POC: NEGATIVE
Mucus, UA: NEGATIVE
WBC, UR, HPF, POC: NEGATIVE
Yeast, UA: NEGATIVE

## 2014-07-05 MED ORDER — SULFAMETHOXAZOLE-TMP DS 800-160 MG PO TABS: 1.0000 | ORAL_TABLET | Freq: Two times a day (BID) | ORAL | Status: DC

## 2014-07-05 NOTE — Patient Instructions (Addendum)
Nicotine Addiction Nicotine can act as both a stimulant (excites/activates) and a sedative (calms/quiets). Immediately after exposure to nicotine, there is a "kick" caused in part by the drug's stimulation of the adrenal glands and resulting discharge of adrenaline (epinephrine). The rush of adrenaline stimulates the body and causes a sudden release of sugar. This means that smokers are always slightly hyperglycemic. Hyperglycemic means that the blood sugar is high, just like in diabetics. Nicotine also decreases the amount of insulin which helps control sugar levels in the body. There is an increase in blood pressure, breathing, and the rate of heart beats.  In addition, nicotine indirectly causes a release of dopamine in the brain that controls pleasure and motivation. A similar reaction is seen with other drugs of abuse, such as cocaine and heroin. This dopamine release is thought to cause the pleasurable sensations when smoking. In some different cases, nicotine can also create a calming effect, depending on sensitivity of the smoker's nervous system and the dose of nicotine taken. WHAT HAPPENS WHEN NICOTINE IS TAKEN FOR LONG PERIODS OF TIME?  Long-term use of nicotine results in addiction. It is difficult to stop.  Repeated use of nicotine creates tolerance. Higher doses of nicotine are needed to get the "kick." When nicotine use is stopped, withdrawal may last a month or more. Withdrawal may begin within a few hours after the last cigarette. Symptoms peak within the first few days and may lessen within a few weeks. For some people, however, symptoms may last for months or longer. Withdrawal symptoms include:   Irritability.  Craving.  Learning and attention deficits.  Sleep disturbances.  Increased appetite. Craving for tobacco may last for 6 months or longer. Many behaviors done while using nicotine can also play a part in the severity of withdrawal symptoms. For some people, the feel,  smell, and sight of a cigarette and the ritual of obtaining, handling, lighting, and smoking the cigarette are closely linked with the pleasure of smoking. When stopped, they also miss the related behaviors which make the withdrawal or craving worse. While nicotine gum and patches may lessen the drug aspects of withdrawal, cravings often persist. WHAT ARE THE MEDICAL CONSEQUENCES OF NICOTINE USE?  Nicotine addiction accounts for one-third of all cancers. The top cancer caused by tobacco is lung cancer. Lung cancer is the number one cancer killer of both men and women.  Smoking is also associated with cancers of the:  Mouth.  Pharynx.  Larynx.  Esophagus.  Stomach.  Pancreas.  Cervix.  Kidney.  Ureter.  Bladder.  Smoking also causes lung diseases such as lasting (chronic) bronchitis and emphysema.  It worsens asthma in adults and children.  Smoking increases the risk of heart disease, including:  Stroke.  Heart attack.  Vascular disease.  Aneurysm.  Passive or secondary smoke can also increase medical risks including:  Asthma in children.  Sudden Infant Death Syndrome (SIDS).  Additionally, dropped cigarettes are the leading cause of residential fire fatalities.  Nicotine poisoning has been reported from accidental ingestion of tobacco products by children and pets. Death usually results in a few minutes from respiratory failure (when a person stops breathing) caused by paralysis. TREATMENT   Medication. Nicotine replacement medicines such as nicotine gum and the patch are used to stop smoking. These medicines gradually lower the dosage of nicotine in the body. These medicines do not contain the carbon monoxide and other toxins found in tobacco smoke.  Hypnotherapy.  Relaxation therapy.  Nicotine Anonymous (a 12-step support   program). Find times and locations in your local yellow pages. Document Released: 07/22/2004 Document Revised: 02/08/2012 Document  Reviewed: 01/12/2014 [facility] Patient Information 2015 [place], [place]. This information is not intended to replace advice given to you by your health care provider. Make sure you discuss any questions you have with your health care provider.    Smoking Cessation, Tips for Success If you are ready to quit smoking, congratulations! You have chosen to help yourself be healthier. Cigarettes bring nicotine, tar, carbon monoxide, and other irritants into your body. Your lungs, heart, and blood vessels will be able to work better without these poisons. There are many different ways to quit smoking. Nicotine gum, nicotine patches, a nicotine inhaler, or nicotine nasal spray can help with physical craving. Hypnosis, support groups, and medicines help break the habit of smoking. WHAT THINGS CAN I DO TO MAKE QUITTING EASIER?  Here are some tips to help you quit for good:  Pick a date when you will quit smoking completely. Tell all of your friends and family about your plan to quit on that date.  Do not try to slowly cut down on the number of cigarettes you are smoking. Pick a quit date and quit smoking completely starting on that day.  Throw away all cigarettes.   Clean and remove all ashtrays from your home, work, and car.  On a card, write down your reasons for quitting. Carry the card with you and read it when you get the urge to smoke.  Cleanse your body of nicotine. Drink enough water and fluids to keep your urine clear or pale yellow. Do this after quitting to flush the nicotine from your body.  Learn to predict your moods. Do not let a bad situation be your excuse to have a cigarette. Some situations in your life might tempt you into wanting a cigarette.  Never have "just one" cigarette. It leads to wanting another and another. Remind yourself of your decision to quit.  Change habits associated with smoking. If you smoked while driving or when feeling stressed, try other activities to replace  smoking. Stand up when drinking your coffee. Brush your teeth after eating. Sit in a different chair when you read the paper. Avoid alcohol while trying to quit, and try to drink fewer caffeinated beverages. Alcohol and caffeine may urge you to smoke.  Avoid foods and drinks that can trigger a desire to smoke, such as sugary or spicy foods and alcohol.  Ask people who smoke not to smoke around you.  Have something planned to do right after eating or having a cup of coffee. For example, plan to take a walk or exercise.  Try a relaxation exercise to calm you down and decrease your stress. Remember, you may be tense and nervous for the first 2 weeks after you quit, but this will pass.  Find new activities to keep your hands busy. Play with a pen, coin, or rubber band. Doodle or draw things on paper.  Brush your teeth right after eating. This will help cut down on the craving for the taste of tobacco after meals. You can also try mouthwash.   Use oral substitutes in place of cigarettes. Try using lemon drops, carrots, cinnamon sticks, or chewing gum. Keep them handy so they are available when you have the urge to smoke.  When you have the urge to smoke, try deep breathing.  Designate your home as a nonsmoking area.  If you are a heavy smoker, ask your health  care provider about a prescription for nicotine chewing gum. It can ease your withdrawal from nicotine.  Reward yourself. Set aside the cigarette money you save and buy yourself something nice.  Look for support from others. Join a support group or smoking cessation program. Ask someone at home or at work to help you with your plan to quit smoking.  Always ask yourself, "Do I need this cigarette or is this just a reflex?" Tell yourself, "Today, I choose not to smoke," or "I do not want to smoke." You are reminding yourself of your decision to quit.  Do not replace cigarette smoking with electronic cigarettes (commonly called  e-cigarettes). The safety of e-cigarettes is unknown, and some may contain harmful chemicals.  If you relapse, do not give up! Plan ahead and think about what you will do the next time you get the urge to smoke. HOW WILL I FEEL WHEN I QUIT SMOKING? You may have symptoms of withdrawal because your body is used to nicotine (the addictive substance in cigarettes). You may crave cigarettes, be irritable, feel very hungry, cough often, get headaches, or have difficulty concentrating. The withdrawal symptoms are only temporary. They are strongest when you first quit but will go away within 10-14 days. When withdrawal symptoms occur, stay in control. Think about your reasons for quitting. Remind yourself that these are signs that your body is healing and getting used to being without cigarettes. Remember that withdrawal symptoms are easier to treat than the major diseases that smoking can cause.  Even after the withdrawal is over, expect periodic urges to smoke. However, these cravings are generally short lived and will go away whether you smoke or not. Do not smoke! WHAT RESOURCES ARE AVAILABLE TO HELP ME QUIT SMOKING? Your health care provider can direct you to community resources or hospitals for support, which may include:  Group support.  Education.  Hypnosis.  Therapy. Document Released: 08/14/2004 Document Revised: 04/02/2014 Document Reviewed: 05/04/2013 [facility] Patient Information 2015 [place], [place]. This information is not intended to replace advice given to you by your health care provider. Make sure you discuss any questions you have with your health care provider.    Prostatitis Prostatitis is redness, soreness, and puffiness (swelling) of the prostate gland. The prostate gland is the walnut-sized gland located just below your bladder. HOME CARE:   Take all medicines as told by your doctor.  Take warm-water baths (sitz baths) as told by your doctor. GET HELP IF:  Your symptoms  get worse, not better.  You have a fever. GET HELP RIGHT AWAY IF:   You have chills.  You feel sick to your stomach (nauseous) or like you will throw up (vomit).  You feel lightheaded or like you will pass out (faint).  You are unable to pee (urinate).  You have blood or blood clumps (clots) in your pee (urine). MAKE SURE YOU:  Understand these instructions.  Will watch your condition.  Will get help right away if you are not doing well or get worse. Document Released: 05/17/2012 Document Revised: 07/19/2013 Document Reviewed: 06/05/2013 [facility] Patient Information 2015 [place], [place]. This information is not intended to replace advice given to you by your health care provider. Make sure you discuss any questions you have with your health care provider.

## 2014-07-06 NOTE — Progress Notes (Signed)
S:  This 53 y.o. AA male has well controlled HTN; PCP is located at [facility] (Dr. Kevan Ny). Pt is compliant w/ medication and has no adverse effects or symptoms. No report of diaphoresis, vision disturbances, CP or tightness, palpitations, SOB or DOE, edema, cough, HA, numbness or syncope.  Today, he c/o mild dysuria and pressure/ heaviness in pelvis. There are no penile lesions or rash or discharge. No hematuria or hematospermia. He has no hx of STDs. When asked if he was sexually active, he responded "not in a long time" then he equivocated more on his answer. Eventually, he admitted that he had never had sex and that he "masturbates a lot".  Patient Active Problem List   Diagnosis Date Noted  . HTN (hypertension) 07/05/2014  . Hearing loss of both ears 03/25/2013  . Cerebral palsy 03/25/2013    Prior to Admission medications   Medication Sig Start Date End Date Taking? Authorizing Provider  Cholecalciferol (VITAMIN D3 PO) Take by mouth daily.   Yes Historical Provider, MD  fluticasone (FLONASE) 50 MCG/ACT nasal spray Place 2 sprays into both nostrils daily. 02/03/14  Yes Thao P Le, DO  lisinopril (PRINIVIL,ZESTRIL) 20 MG tablet Take 20 mg by mouth daily.     Yes Historical Provider, MD  naproxen (NAPROSYN) 500 MG tablet Take 500 mg by mouth 2 (two) times daily with a meal.    Yes Historical Provider, MD  omeprazole (PRILOSEC) 40 MG capsule Take 40 mg by mouth daily.   Yes Historical Provider, MD  OVER THE COUNTER MEDICATION daily.   Yes Historical Provider, MD  hydrochlorothiazide 25 MG tablet Take 25 mg by mouth daily.      Historical Provider, MD  HYDROcodone-homatropine (HYCODAN) 5-1.5 MG/5ML syrup Take 5 mLs by mouth at bedtime as needed for cough. 02/03/14   Thao P Le, DO    No Known Allergies  Past Surgical History  Procedure Laterality Date  . Foot surgery      due to CP  . Colonoscopy  05/01/2011    diverticulosis.  Brodie.    History    Social History  . Marital Status: Single    Spouse Name: N/A    Number of Children: N/A  . Years of Education: N/A   Occupational History  . Not on file.   Social History Main Topics  . Smoking status: Current Every Day Smoker -- 0.50 packs/day for 15 years    Types: Cigarettes  . Smokeless tobacco: Never Used  . Alcohol Use: 1.5 oz/week    3 drink(s) per week  . Drug Use: No  . Sexual Activity: Not Currently   Other Topics Concern  . Not on file   Social History Narrative  . No narrative on file    Family History  Problem Relation Age of Onset  . Prostate cancer Father     ROS: As per HPI. Negative for fever/chills, fatigue, n/v, abd pain, GI problem, HA, dizziness, weakness or syncope.  O: Filed Vitals:   07/05/14 1149  BP: 120/68  Pulse: 77  Temp: 98.7 F (37.1 C)  Resp: 16   GEN: In NAD; WN,WD. HENT: [place]/AT; eyes/ext ears/nose and oropharynx unremarkable. COR: RRR. LUNGS: Unlabored resp. BACK: No CVAT. SKIN: W&D; intact w/o rashes or diaphoresis. NEURO: A&O x 3; CNs intact. Gait spasticity.  Results for orders placed in visit on 07/05/14  POCT UA - MICROSCOPIC ONLY      Result Value Ref Range   WBC,  Ur, HPF, POC neg     RBC, urine, microscopic 3-5     Bacteria, U Microscopic trace     Mucus, UA neg     Epithelial cells, urine per micros 0-2     Crystals, Ur, HPF, POC neg     Casts, Ur, LPF, POC neg     Yeast, UA neg    POCT URINALYSIS DIPSTICK      Result Value Ref Range   Color, UA yellow     Clarity, UA clear     Glucose, UA neg     Bilirubin, UA neg     Ketones, UA neg     Spec Grav, UA <=1.005     Blood, UA neg     pH, UA 6.0     Protein, UA neg     Urobilinogen, UA 0.2     Nitrite, UA neg     Leukocytes, UA Negative      A/P: Dysuria - Suspect prostatitis.Hx of BPH. Plan: POCT UA - Microscopic Only, POCT urinalysis dipstick, GC/Chlamydia Probe Amp  Essential hypertension- Stable on current medication; continue same.  Acute  prostatitis - Plan: GC/Chlamydia Probe Amp  History of BPH - Plan: GC/Chlamydia Probe Amp  Meds ordered this encounter      . sulfamethoxazole-trimethoprim (BACTRIM DS) 800-160 MG per tablet    Sig: Take 1 tablet by mouth 2 (two) times daily.    Dispense:  30 tablet    Refill:  1

## 2014-07-07 LAB — GC/CHLAMYDIA PROBE AMP
CT PROBE, AMP APTIMA: NEGATIVE
GC PROBE AMP APTIMA: NEGATIVE

## 2014-07-09 ENCOUNTER — Telehealth: Payer: Self-pay | Admitting: *Deleted

## 2014-07-09 NOTE — Telephone Encounter (Signed)
Dr. Leward Quan wrote a prescription for sulfamethoxazole-trimethoprim (BACTRIM DS) 800-160 MG per tablet on 07/05/2014 for 30 pills.   Pt is wanting to know he is supposed to take all 30 pills.  Pt [phone]

## 2014-07-11 ENCOUNTER — Telehealth: Payer: Self-pay | Admitting: Family Medicine

## 2014-07-11 NOTE — Telephone Encounter (Signed)
Spoke with patient advised him to take all antibiotic so infection want come back patient undersyands

## 2014-07-11 NOTE — Telephone Encounter (Signed)
LMOM for pt to return call regarding question about his prescription.

## 2014-07-11 NOTE — Telephone Encounter (Signed)
Left message on machine to call us back.

## 2014-09-14 ENCOUNTER — Other Ambulatory Visit: Payer: Self-pay

## 2016-01-28 ENCOUNTER — Ambulatory Visit: Payer: Medicare HMO | Attending: Family Medicine | Admitting: Occupational Therapy

## 2016-01-28 ENCOUNTER — Encounter: Payer: Self-pay | Admitting: Occupational Therapy

## 2016-01-28 ENCOUNTER — Ambulatory Visit: Payer: Medicare HMO | Admitting: Physical Therapy

## 2016-01-28 DIAGNOSIS — R29898 Other symptoms and signs involving the musculoskeletal system: Secondary | ICD-10-CM | POA: Diagnosis present

## 2016-01-28 DIAGNOSIS — M25512 Pain in left shoulder: Secondary | ICD-10-CM | POA: Insufficient documentation

## 2016-01-28 DIAGNOSIS — Z539 Procedure and treatment not carried out, unspecified reason: Secondary | ICD-10-CM | POA: Insufficient documentation

## 2016-01-28 NOTE — Therapy (Signed)
[facility] [address] [place], [place], [address] Phone: [phone]   Fax:  [phone]  Occupational Therapy Evaluation  Patient Details  Name: Dustin Mora MRN: [id number] Date of Birth: May 07, 1961 Referring Provider: Randall Hiss DeAN  Encounter Date: 01/28/2016      OT End of Session - 01/28/16 2233    Visit Number 1   Number of Visits 17   Date for OT Re-Evaluation 04/26/16   Authorization Type [employer] - prior authorization required   Authorization - Visit Number 1   OT Start Time [id number]   OT Stop Time 1401   OT Time Calculation (min) 44 min   Activity Tolerance Patient tolerated treatment well   Behavior During Therapy [facility] for tasks assessed/performed      Past Medical History  Diagnosis Date  . Hypertension   . Hammer toe     right  . Hearing loss of both ears     Pt has bilateral hearing aids  . Swollen L knee   . Arthritis   . Cerebral palsy [facility])     Past Surgical History  Procedure Laterality Date  . Foot surgery      due to CP  . Colonoscopy  05/01/2011    diverticulosis.  Brodie.    There were no vitals filed for this visit.  Visit Diagnosis:  Pain in joint of left shoulder - Plan: Ot plan of care cert/re-cert  Weakness of shoulder - Plan: Ot plan of care cert/re-cert      Subjective Assessment - 01/28/16 2213    Currently in Pain? Yes   Pain Score 4    Pain Location Shoulder   Pain Orientation Posterior   Pain Descriptors / Indicators Aching   Pain Type Chronic pain   Pain Radiating Towards From posterior shoulder down arm (lateral)   Pain Onset More than a month ago   Pain Frequency Intermittent   Aggravating Factors  lifting, end range   Pain Relieving Factors rest   Effect of Pain on Daily Activities limits ability to lift with dominant left   Multiple Pain Sites No           [facility] OT Assessment - 01/28/16 0001    Assessment   Diagnosis Shoulder pain with DJD   Referring Provider Randall Hiss DeAN   Onset Date 01/28/15   Prior Therapy Not for this condition   Precautions   Precautions Fall   Restrictions   Weight Bearing Restrictions No   Balance Screen   Has the patient fallen in the past 6 months No   Home  Environment   Family/patient expects to be discharged to: Private residence   Living Arrangements Alone   Type of [place] One level   Bathroom Shower/Tub Tub/Shower unit   Bathroom Accessibility Yes   How accessible Accessible via walker   [place] - single point;Walker - 4 wheels   Prior [place] On disability   Leisure reading, TV   ADL   Eating/Feeding Independent   Upper Body Bathing Independent   Lower Body Bathing Independent   Upper Body Dressing Independent   Lower Body Dressing Increased time;Modified independent   Toilet Tranfer Modified independent   Tub/Shower Transfer Modified independent   Mobility   Mobility Status Comments Walks with cane in community, uses rollator in home.     Written Expression   Dominant Hand Left   Handwriting 100% legible   Coordination  Gross Motor Movements are Fluid and Coordinated No   Perception   Perception Within Functional Limits   Praxis   Praxis Intact   Tone   Assessment Location Left Upper Extremity   ROM / Strength   AROM / PROM / Strength AROM;Strength   AROM   AROM Assessment Site Shoulder   Right/Left Shoulder Left   Left Shoulder Extension 60 Degrees   Left Shoulder Flexion 130 Degrees   Left Shoulder ABduction 130 Degrees   Left Shoulder Internal Rotation 90 Degrees   Left Shoulder External Rotation 90 Degrees   Left Shoulder Horizontal ABduction 30 Degrees   Strength   Overall Strength Deficits   Strength Assessment Site Shoulder   Right/Left Shoulder Right;Left   Right Shoulder Flexion 4/5   Right Shoulder Extension 4/5   Right Shoulder ABduction 4/5   Right Shoulder Internal Rotation 4/5   Right Shoulder External  Rotation 4/5   Left Shoulder Flexion 4/5   Left Shoulder Extension 4-/5   Left Shoulder ABduction 4/5   Left Shoulder Internal Rotation 4+/5   Left Shoulder External Rotation 4+/5   Left Shoulder Horizontal ABduction 4/5   Left Shoulder Horizontal ADduction 4-/5   Hand Function   Right Hand Gross Grasp Impaired   Right Hand Grip (lbs) 35   Left Hand Gross Grasp Functional   Left Hand Grip (lbs) 55                         OT Education - 01/28/16 2230    Education provided Yes   Education Details Degenerative Joint Disease, importance of joint protection   Person(s) Educated Patient   Methods Explanation   Comprehension Verbalized understanding          OT Short Term Goals - 01/28/16 2245    OT SHORT TERM GOAL #1   Title Patient will demonstrate safe paerformance with HEP with no reported increase in pain in left UE due 3/28   Baseline No current exercise program   Time 4   Period Weeks   Status New   OT SHORT TERM GOAL #2   Title Patient will demonstrate increase in shoulder flexion to 135 degrees to reach to eye level to retrieve 3 lb object from shelf    Baseline 130 degrees shoulder flexion   Time 4   Period Weeks   Status New   OT SHORT TERM GOAL #3   Title Patient will report pain no greater than 3/10 during repetitive motion IADL activity, e.g. washing, drying, utting away dishes   Baseline Pain consistent at 4/10   Time 4   Period Weeks   Status New           OT Long Term Goals - 01/28/16 2250    OT LONG TERM GOAL #1   Title Patient will be independent with updated HEP for stretching and strengthening dominant left UE due 4/28   Baseline No current program   Time 8   Period Weeks   Status New   OT LONG TERM GOAL #2   Title Patient will demonstrate at least 145 degrees of shoulder flexion to reach to high shelf to retrieve 5-10 lb object, and repeat x 5   Baseline 130 degrees of shoulder flexion   Time 8   Period Weeks   Status New    OT LONG TERM GOAL #3   Title Patient will verbally identify 2-3 UE joint protection techniques to aide in joint  preservation as he is so relaint on upper extremities for mobility as well as for ADL function   Baseline patient unaware of joint protection techniques   Time 8   Period Weeks   Status New   OT LONG TERM GOAL #4   Title Patient will explore various adaptive equipemt options to reduce stress on UE joints for ADL/IADL   Baseline Unfamiliar with adaptive equipment   Time 8   Period Weeks   Status New               Plan - 01/28/16 2235    Clinical Impression Statement Patient is a 55 year old male with diplegic Cerebral Palsy, with report of shoulder pain over past year, and recent diagnosis of degenerative joint disease in dominant left side.  Patient is reporting shoulder pain in impeding sleep, and he is waking with numbness and tingling in elbow and radiating toward digits each am.  Patient is able to complete basic self care skills at this time, but is requiring increased time to complete.  Patient will benefit from skilled OT Intervention to address long term joint protection as patient is heavily relaint on upper extremities for both self care and mobilty tasks, to improve positioning for enhanced sleep, and to reduce overall pain and improve strength in left upper extremity.     Pt will benefit from skilled therapeutic intervention in order to improve on the following deficits (Retired) Abnormal gait;Decreased balance;Decreased coordination;Decreased knowledge of use of DME;Decreased mobility;Decreased strength;Decreased range of motion;Impaired UE functional use;Pain   Rehab Potential Good   OT Frequency 2x / week   OT Duration 8 weeks   OT Treatment/Interventions Self-care/ADL training;Moist Heat;Therapeutic exercise;Manual Therapy;DME and/or AE instruction;Patient/family education;Therapeutic activities;Balance training   Plan Initiate HEP stretching and  strengthening left shoulder   Consulted and Agree with Plan of Care Patient        Problem List Patient Active Problem List   Diagnosis Date Noted  . HTN (hypertension) 07/05/2014  . Hearing loss of both ears 03/25/2013  . Cerebral palsy ([place]) 03/25/2013    Mariah Milling, [name] 01/28/2016, 11:02 PM  [facility] [address] [place], [place], [address] Phone: [phone]   Fax:  [phone]  Name: Dustin Mora MRN: [id number] Date of Birth: 1961-01-17

## 2016-02-05 NOTE — Therapy (Signed)
[facility] [address] [place], [place], [address] Phone: [phone]   Fax:  [phone]  Occupational Therapy Evaluation  Patient Details  Name: CHIKE MAZZOTTI MRN: [id number] Date of Birth: 07-12-1961 Referring Provider: Randall Hiss DeAN  Encounter Date: 01/28/2016    Past Medical History  Diagnosis Date  . Hypertension   . Hammer toe     right  . Hearing loss of both ears     Pt has bilateral hearing aids  . Swollen L knee   . Arthritis   . Cerebral palsy [facility])     Past Surgical History  Procedure Laterality Date  . Foot surgery      due to CP  . Colonoscopy  05/01/2011    diverticulosis.  Brodie.    There were no vitals filed for this visit.  Visit Diagnosis:  Pain in joint of left shoulder - Plan: Ot plan of care cert/re-cert  Weakness of shoulder - Plan: Ot plan of care cert/re-cert                            OT Short Term Goals - 01/28/16 2245    OT SHORT TERM GOAL #1   Title Patient will demonstrate safe paerformance with HEP with no reported increase in pain in left UE due 3/28   Baseline No current exercise program   Time 4   Period Weeks   Status New   OT SHORT TERM GOAL #2   Title Patient will demonstrate increase in shoulder flexion to 135 degrees to reach to eye level to retrieve 3 lb object from shelf    Baseline 130 degrees shoulder flexion   Time 4   Period Weeks   Status New   OT SHORT TERM GOAL #3   Title Patient will report pain no greater than 3/10 during repetitive motion IADL activity, e.g. washing, drying, utting away dishes   Baseline Pain consistent at 4/10   Time 4   Period Weeks   Status New           OT Long Term Goals - 01/28/16 2250    OT LONG TERM GOAL #1   Title Patient will be independent with updated HEP for stretching and strengthening dominant left UE due 4/28   Baseline No current program   Time 8   Period Weeks   Status New   OT LONG TERM GOAL #2   Title Patient will demonstrate at least 145 degrees of shoulder flexion to reach to high shelf to retrieve 5-10 lb object, and repeat x 5   Baseline 130 degrees of shoulder flexion   Time 8   Period Weeks   Status New   OT LONG TERM GOAL #3   Title Patient will verbally identify 2-3 UE joint protection techniques to aide in joint preservation as he is so relaint on upper extremities for mobility as well as for ADL function   Baseline patient unaware of joint protection techniques   Time 8   Period Weeks   Status New   OT LONG TERM GOAL #4   Title Patient will explore various adaptive equipemt options to reduce stress on UE joints for ADL/IADL   Baseline Unfamiliar with adaptive equipment   Time 8   Period Weeks   Status New                 G-Codes - 03-03-16 1412    Functional  Assessment Tool Used skilled clinical observation   Functional Limitation Carrying, moving and handling objects   Carrying, Moving and Handling Objects Current Status [phone]) At least 20 percent but less than 40 percent impaired, limited or restricted   Carrying, Moving and Handling Objects Goal Status [id number]) At least 1 percent but less than 20 percent impaired, limited or restricted      Problem List Patient Active Problem List   Diagnosis Date Noted  . HTN (hypertension) 07/05/2014  . Hearing loss of both ears 03/25/2013  . Cerebral palsy ([place]) 03/25/2013    Mariah Milling 02/05/2016, 2:14 PM  [facility] [address] [place], [place], [address] Phone: [phone]   Fax:  [phone]  Name: OLEGARIO DOCTOR MRN: [id number] Date of Birth: 22-Feb-1961

## 2016-02-11 ENCOUNTER — Ambulatory Visit: Payer: Medicare HMO | Admitting: Occupational Therapy

## 2016-02-13 ENCOUNTER — Ambulatory Visit: Payer: Medicare HMO | Attending: Family Medicine | Admitting: Occupational Therapy

## 2016-02-13 DIAGNOSIS — R29898 Other symptoms and signs involving the musculoskeletal system: Secondary | ICD-10-CM | POA: Diagnosis present

## 2016-02-13 DIAGNOSIS — M25512 Pain in left shoulder: Secondary | ICD-10-CM | POA: Insufficient documentation

## 2016-02-13 NOTE — Therapy (Signed)
[facility] [address] [place], [place], [address] Phone: [phone]   Fax:  [phone]  Occupational Therapy Treatment  Patient Details  Name: Dustin Mora MRN: [id number] Date of Birth: 03-Jul-1961 Referring Provider: Randall Hiss DeAN  Encounter Date: 02/13/2016      OT End of Session - 02/13/16 1602    Visit Number 2   Number of Visits 17   Date for OT Re-Evaluation 04/26/16   Authorization Type [employer] - prior authorization required   Authorization - Visit Number 2   OT Start Time [id number]   OT Stop Time 1538   OT Time Calculation (min) 53 min   Activity Tolerance Patient tolerated treatment well   Behavior During Therapy [facility] for tasks assessed/performed      Past Medical History  Diagnosis Date  . Hypertension   . Hammer toe     right  . Hearing loss of both ears     Pt has bilateral hearing aids  . Swollen L knee   . Arthritis   . Cerebral palsy [facility])     Past Surgical History  Procedure Laterality Date  . Foot surgery      due to CP  . Colonoscopy  05/01/2011    diverticulosis.  Brodie.    There were no vitals filed for this visit.  Visit Diagnosis:  Pain in joint of left shoulder  Weakness of shoulder      Subjective Assessment - 02/13/16 1535    Subjective  I feel twinges of pain in my shoulder   Patient Stated Goals I am really hoping to get my strength back on my dominant side   Currently in Pain? Yes   Pain Score 2    Pain Location Shoulder   Pain Orientation Posterior   Pain Descriptors / Indicators Aching   Pain Type Chronic pain   Pain Onset More than a month ago   Pain Frequency Intermittent   Aggravating Factors  Lifting   Pain Relieving Factors rest   Effect of Pain on Daily Activities limits ability to lift with dominant left   Multiple Pain Sites No                      OT Treatments/Exercises (OP) - 02/13/16 0001    ADLs   LB Dressing Patient with  limited active movement and control in LE's and uses each arm individually to suspend a leg while donning socks and shoes.  Discussed joint protection / energy conservation techniques for lower body dressing - patient may benefit from use of a foot stool to raise foot for donning shoes - practiced in clinic.  Demonstrated and allowed patient to practice with sock aide.  Patient ablke to effectively don socks with sock aide, yet he expressed  frustration at the extra steps to utilize this tool.     Bathing Patient beginning to have difficulty and pain with attempts to wash his back.  Discussed the benefits of using a long handled bath sponge/brush to aide with this task.  Patient thought this might be very useful.     Functional Mobility Patient with pain which has progressed over past year in dominant left shoulder.  Patient ambulates with a cane,in the community, using predominantly left UE.  Patient ambulates shorter distances in his home with a rollator.  Discussed with patient that from a joint protection stand point, patient woul dhave less wear on shoulder joint if he opted to walk  longer distances (community) with rollator.  Patient is aware that this is an option, but at this time is not interested in transitioning to a more restrictive walking device.  Reviewed sleep positions in sidelying to promote comfort.   Exercises   Exercises Shoulder   Shoulder Exercises: Supine   Flexion AROM;Strengthening;Left;10 reps;Weights   Shoulder Flexion Weight (lbs) --  3   ABduction AROM;Strengthening;Left;10 reps;Weights   Shoulder ABduction Weight (lbs) 3   Moist Heat Therapy   Number Minutes Moist Heat 10 Minutes   Moist Heat Location Shoulder  left                OT Education - 02/13/16 1602    Education provided Yes   Education Details Adaptive equipment to aide with bathing and lower body dressing, sleep positions   Person(s) Educated Patient   Methods Explanation;Demonstration    Comprehension Verbalized understanding;Returned demonstration          OT Short Term Goals - 02/13/16 1607    OT SHORT TERM GOAL #1   Title Patient will demonstrate safe paerformance with HEP with no reported increase in pain in left UE due 3/28   Status On-going   OT SHORT TERM GOAL #2   Title Patient will demonstrate increase in shoulder flexion to 135 degrees to reach to eye level to retrieve 3 lb object from shelf    Status On-going   OT SHORT TERM GOAL #3   Title Patient will report pain no greater than 3/10 during repetitive motion IADL activity, e.g. washing, drying, utting away dishes   Status On-going           OT Long Term Goals - 02/13/16 1607    OT LONG TERM GOAL #1   Title Patient will be independent with updated HEP for stretching and strengthening dominant left UE due 4/28   Status On-going   OT LONG TERM GOAL #2   Title Patient will demonstrate at least 145 degrees of shoulder flexion to reach to high shelf to retrieve 5-10 lb object, and repeat x 5   Status On-going   OT LONG TERM GOAL #3   Title Patient will verbally identify 2-3 UE joint protection techniques to aide in joint preservation as he is so relaint on upper extremities for mobility as well as for ADL function   Status On-going   OT LONG TERM GOAL #4   Title Patient will explore various adaptive equipemt options to reduce stress on UE joints for ADL/IADL   Status On-going               Plan - 02/13/16 1605    Clinical Impression Statement Patient reports pain is intermittent, but daily in dominant left shoulder.  Patient dependent on UE's for functional mobility, and is open to learning about joint protection and adaptive techniques to continue independence with ADL/IADL and functional mobility   Pt will benefit from skilled therapeutic intervention in order to improve on the following deficits (Retired) Abnormal gait;Decreased balance;Decreased coordination;Decreased knowledge of use of  DME;Decreased mobility;Decreased strength;Decreased range of motion;Impaired UE functional use;Pain   Rehab Potential Good   OT Frequency 2x / week   OT Duration 8 weeks   OT Treatment/Interventions Self-care/ADL training;Moist Heat;Therapeutic exercise;Manual Therapy;DME and/or AE instruction;Patient/family education;Therapeutic activities;Balance training   Plan Initiate HEP stretching and strengthening shoulder   Consulted and Agree with Plan of Care Patient        Problem List Patient Active Problem List   Diagnosis Date  Noted  . HTN (hypertension) 07/05/2014  . Hearing loss of both ears 03/25/2013  . Cerebral palsy ([place]) 03/25/2013    Mariah Milling, [name] 02/13/2016, 4:12 PM  [facility] [address] [place], [place], [address] Phone: [phone]   Fax:  [phone]  Name: Dustin Mora MRN: [id number] Date of Birth: January 01, 1961

## 2016-02-18 ENCOUNTER — Ambulatory Visit: Payer: Medicare HMO | Admitting: Occupational Therapy

## 2016-02-18 DIAGNOSIS — M25512 Pain in left shoulder: Secondary | ICD-10-CM

## 2016-02-18 DIAGNOSIS — R29898 Other symptoms and signs involving the musculoskeletal system: Secondary | ICD-10-CM

## 2016-02-18 NOTE — Therapy (Signed)
[facility] [address] [place], [place], [address] Phone: [phone]   Fax:  [phone]  Occupational Therapy Treatment  Patient Details  Name: Dustin Mora MRN: [id number] Date of Birth: 09/23/1961 Referring Provider: Randall Hiss DeAN  Encounter Date: 02/18/2016      OT End of Session - 02/18/16 1657    Visit Number 3   Number of Visits 17   Date for OT Re-Evaluation 04/26/16   Authorization Type [employer] - prior authorization required   OT Start Time 1535   OT Stop Time 1625   OT Time Calculation (min) 50 min   Activity Tolerance Patient tolerated treatment well   Behavior During Therapy [facility] for tasks assessed/performed      Past Medical History  Diagnosis Date  . Hypertension   . Hammer toe     right  . Hearing loss of both ears     Pt has bilateral hearing aids  . Swollen L knee   . Arthritis   . Cerebral palsy [facility])     Past Surgical History  Procedure Laterality Date  . Foot surgery      due to CP  . Colonoscopy  05/01/2011    diverticulosis.  Brodie.    There were no vitals filed for this visit.  Visit Diagnosis:  Pain in joint of left shoulder  Weakness of shoulder      Subjective Assessment - 02/18/16 1543    Subjective  Pt reports consistent pain in left shoulder   Patient Stated Goals I am really hoping to get my strength back on my dominant side   Currently in Pain? Yes   Pain Score 3    Pain Location Shoulder   Pain Descriptors / Indicators Aching   Pain Type Chronic pain   Pain Onset More than a month ago   Pain Frequency Intermittent   Aggravating Factors  lifting   Pain Relieving Factors rest   Effect of Pain on Daily Activities limits ability with dominant left                      OT Treatments/Exercises (OP) - 02/18/16 0001    Exercises   Exercises Shoulder;Elbow   Shoulder Exercises: Supine   Flexion AROM;AAROM;Strengthening  A/ROM bilateral with ball  x 10, chest press x 10   Shoulder Flexion Weight (lbs) 3   ABduction AROM;Strengthening;Left;Weights;20 reps   Shoulder ABduction Weight (lbs) 2   Other Supine Exercises biceps curls 2 sets of 10 reps with 3lbs, triceps extension 2 sets of 10 reps with 2 lbs weight   Moist Heat Therapy   Number Minutes Moist Heat 10 Minutes   Moist Heat Location Shoulder     education regarding shoulder positioning with scapular/ shoulder depression with shoulder flexion. therapist had pt trial a large based quad cane for improved stability and decreased weightbearing through LUE, pt is not interested in a change. (Threapist also encouraged use of rollator, yet pt is not interested.)             OT Short Term Goals - 02/13/16 1607    OT SHORT TERM GOAL #1   Title Patient will demonstrate safe paerformance with HEP with no reported increase in pain in left UE due 3/28   Status On-going   OT SHORT TERM GOAL #2   Title Patient will demonstrate increase in shoulder flexion to 135 degrees to reach to eye level to retrieve 3 lb object from shelf  Status On-going   OT SHORT TERM GOAL #3   Title Patient will report pain no greater than 3/10 during repetitive motion IADL activity, e.g. washing, drying, utting away dishes   Status On-going           OT Long Term Goals - 02/13/16 1607    OT LONG TERM GOAL #1   Title Patient will be independent with updated HEP for stretching and strengthening dominant left UE due 4/28   Status On-going   OT LONG TERM GOAL #2   Title Patient will demonstrate at least 145 degrees of shoulder flexion to reach to high shelf to retrieve 5-10 lb object, and repeat x 5   Status On-going   OT LONG TERM GOAL #3   Title Patient will verbally identify 2-3 UE joint protection techniques to aide in joint preservation as he is so relaint on upper extremities for mobility as well as for ADL function   Status On-going   OT LONG TERM GOAL #4   Title Patient will explore  various adaptive equipemt options to reduce stress on UE joints for ADL/IADL   Status On-going               Plan - 02/18/16 1609    Clinical Impression Statement Pt reports pain in left shoulder particularly A-C joint. Therapist  discussed shoulder positioning with pt. to minimize pain/  improve position.   Pt will benefit from skilled therapeutic intervention in order to improve on the following deficits (Retired) Abnormal gait;Decreased balance;Decreased coordination;Decreased knowledge of use of DME;Decreased mobility;Decreased strength;Decreased range of motion;Impaired UE functional use;Pain   Rehab Potential Good   OT Frequency 2x / week   OT Duration 8 weeks   OT Treatment/Interventions Self-care/ADL training;Moist Heat;Therapeutic exercise;Manual Therapy;DME and/or AE instruction;Patient/family education;Therapeutic activities;Balance training   Plan initiate HEP for gentle stretching and strengthening   Consulted and Agree with Plan of Care Patient        Problem List Patient Active Problem List   Diagnosis Date Noted  . HTN (hypertension) 07/05/2014  . Hearing loss of both ears 03/25/2013  . Cerebral palsy ([employer]) 03/25/2013    Dustin Mora 02/18/2016, 4:59 PM [name], [name] [phone] Phone: [phone] 4:59 PM 02/18/2016 [facility] [address] [place], [place], [address] Phone: [phone]   Fax:  [phone]  Name: Dustin Mora MRN: [id number] Date of Birth: [birth year]

## 2016-02-20 ENCOUNTER — Ambulatory Visit: Payer: Medicare HMO | Admitting: Occupational Therapy

## 2016-02-20 DIAGNOSIS — M25512 Pain in left shoulder: Secondary | ICD-10-CM

## 2016-02-20 DIAGNOSIS — R29898 Other symptoms and signs involving the musculoskeletal system: Secondary | ICD-10-CM

## 2016-02-20 NOTE — Therapy (Signed)
[facility] [address] [place], [place], [address] Phone: [phone]   Fax:  [phone]  Occupational Therapy Treatment  Patient Details  Name: Dustin Mora MRN: [id number] Date of Birth: 11-Feb-1961 Referring Provider: Randall Hiss DeAN  Encounter Date: 02/20/2016      OT End of Session - 02/20/16 1556    Visit Number 4   Number of Visits 17   Date for OT Re-Evaluation 04/26/16   Authorization - Visit Number 4   Authorization - Number of Visits 10   OT Start Time [id number]   OT Stop Time 1615   OT Time Calculation (min) 41 min   Activity Tolerance Patient tolerated treatment well   Behavior During Therapy [facility] for tasks assessed/performed      Past Medical History  Diagnosis Date  . Hypertension   . Hammer toe     right  . Hearing loss of both ears     Pt has bilateral hearing aids  . Swollen L knee   . Arthritis   . Cerebral palsy [facility])     Past Surgical History  Procedure Laterality Date  . Foot surgery      due to CP  . Colonoscopy  05/01/2011    diverticulosis.  [place]rodie.    There were no vitals filed for this visit.  Visit Diagnosis:  Weakness of shoulder  Pain in joint of left shoulder           OT Treatments/Exercises (OP) - 02/18/16 0001     Exercises    Exercises  Shoulder;Elbow    Shoulder Exercises: Supine    Flexion  AROM;AAROM;Strengthening  Supine A/ROM bilateral with towel, chest press x 15, shoulder flexion x 15 instructed to perform as HEP   Shoulder Flexion Weight (lbs)  3    ABduction  AROM;Strengthening;Left;Weights;15 reps    Shoulder ABduction Weight (lbs)  2    Other Supine Exercises  biceps curls 2 sets of 10 reps with 3lbs, triceps extension 2 sets of 10 reps with 2 lbs weight   Functional mid to high range reaching with LUE to place graded clothespins on vertical antennae with LUE min v.c. To avoid compensation. Arm bike x 6 mins level 3 for  conditioning                 OT Education - 02/20/16 1721    Education provided Yes   Education Details HEP for A/ROM with towel   Person(s) Educated Patient   Methods Explanation;Demonstration   Comprehension Verbalized understanding;Returned demonstration          OT Short Term Goals - 02/20/16 1555    OT SHORT TERM GOAL #1   Title Patient will demonstrate safe paerformance with HEP with no reported increase in pain in left UE due 3/28   Status On-going   OT SHORT TERM GOAL #2   Title Patient will demonstrate increase in shoulder flexion to 135 degrees to reach to eye level to retrieve 3 lb object from shelf    Status On-going   OT SHORT TERM GOAL #3   Title Patient will report pain no greater than 3/10 during repetitive motion IADL activity, e.g. washing, drying, utting away dishes   Status On-going           OT Long Term Goals - 02/20/16 1555    OT LONG TERM GOAL #1   Title Patient will be independent with updated HEP for stretching and strengthening dominant left UE due  4/28   Status On-going   OT LONG TERM GOAL #2   Title Patient will demonstrate at least 145 degrees of shoulder flexion to reach to high shelf to retrieve 5-10 lb object, and repeat x 5   Status On-going   OT LONG TERM GOAL #3   Title Patient will verbally identify 2-3 UE joint protection techniques to aide in joint preservation as he is so relaint on upper extremities for mobility as well as for ADL function   Status On-going   OT LONG TERM GOAL #4   Title Patient will explore various adaptive equipemt options to reduce stress on UE joints for ADL/IADL   Status On-going               Plan - 02/20/16 1552    Clinical Impression Statement Pt demonstrates decreased overall pain today, and is progressing towards goals.   Pt will benefit from skilled therapeutic intervention in order to improve on the following deficits (Retired) Abnormal gait;Decreased balance;Decreased  coordination;Decreased knowledge of use of DME;Decreased mobility;Decreased strength;Decreased range of motion;Impaired UE functional use;Pain   Rehab Potential Good   OT Frequency 2x / week   OT Duration 8 weeks   OT Treatment/Interventions Self-care/ADL training;Moist Heat;Therapeutic exercise;Manual Therapy;DME and/or AE instruction;Patient/family education;Therapeutic activities;Balance training   Plan progress UE functional use and strength   Consulted and Agree with Plan of Care Patient        Problem List Patient Active Problem List   Diagnosis Date Noted  . HTN (hypertension) 07/05/2014  . Hearing loss of both ears 03/25/2013  . Cerebral palsy ([place]) 03/25/2013    RINE,KATHRYN 02/20/2016, 5:22 PM [name], [name] [phone] Phone: [phone] 5:22 PM 02/20/2016 [facility] [address] [place], [place], [address] Phone: [phone]   Fax:  [phone]  Name: Dustin Mora MRN: [id number] Date of Birth: 08-15-1961

## 2016-02-20 NOTE — Patient Instructions (Signed)
Laying on your back hold a towel taught with both hands, raise towel towards ceiling then back down with elbows bent 2 sets of 10 reps Then holding towel with both hands, raise arms up overhead keeping elbows straight then lower to your waist 2 set of 10 reps

## 2016-02-25 ENCOUNTER — Ambulatory Visit: Payer: Medicare HMO | Admitting: Occupational Therapy

## 2016-02-25 DIAGNOSIS — M25512 Pain in left shoulder: Secondary | ICD-10-CM | POA: Diagnosis not present

## 2016-02-25 DIAGNOSIS — R29898 Other symptoms and signs involving the musculoskeletal system: Secondary | ICD-10-CM

## 2016-02-25 NOTE — Therapy (Signed)
[facility] [address] [place], [place], [address] Phone: [phone]   Fax:  [phone]  Occupational Therapy Treatment  Patient Details  Name: Dustin Mora MRN: [id number] Date of Birth: [birth year] Referring Provider: Randall Hiss DeAN  Encounter Date: 02/25/2016      OT End of Session - 02/25/16 1421    Visit Number 5   Number of Visits 17   Date for OT Re-Evaluation 04/26/16   Authorization Type [employer] - prior authorization required   Authorization - Visit Number 5   Authorization - Number of Visits 10   OT Start Time [id number]   OT Stop Time 1445   OT Time Calculation (min) 40 min   Activity Tolerance Patient tolerated treatment well   Behavior During Therapy [facility] for tasks assessed/performed      Past Medical History  Diagnosis Date  . Hypertension   . Hammer toe     right  . Hearing loss of both ears     Pt has bilateral hearing aids  . Swollen L knee   . Arthritis   . Cerebral palsy [facility])     Past Surgical History  Procedure Laterality Date  . Foot surgery      due to CP  . Colonoscopy  05/01/2011    diverticulosis.  Brodie.    There were no vitals filed for this visit.  Visit Diagnosis:  Weakness of shoulder  Pain in joint of left shoulder      Subjective Assessment - 02/25/16 1412    Patient Stated Goals I am really hoping to get my strength back on my dominant side   Currently in Pain? Yes   Pain Score 3    Pain Location Shoulder   Pain Orientation Posterior   Pain Descriptors / Indicators Aching   Pain Type Chronic pain   Pain Onset More than a month ago   Pain Frequency Intermittent   Aggravating Factors  lifting   Pain Relieving Factors rest                                 OT Short Term Goals - 02/20/16 1555    OT SHORT TERM GOAL #1   Title Patient will demonstrate safe paerformance with HEP with no reported increase in pain in left UE due 3/28   Status  On-going   OT SHORT TERM GOAL #2   Title Patient will demonstrate increase in shoulder flexion to 135 degrees to reach to eye level to retrieve 3 lb object from shelf    Status On-going   OT SHORT TERM GOAL #3   Title Patient will report pain no greater than 3/10 during repetitive motion IADL activity, e.g. washing, drying, utting away dishes   Status On-going           OT Long Term Goals - 02/20/16 1555    OT LONG TERM GOAL #1   Title Patient will be independent with updated HEP for stretching and strengthening dominant left UE due 4/28   Status On-going   OT LONG TERM GOAL #2   Title Patient will demonstrate at least 145 degrees of shoulder flexion to reach to high shelf to retrieve 5-10 lb object, and repeat x 5   Status On-going   OT LONG TERM GOAL #3   Title Patient will verbally identify 2-3 UE joint protection techniques to aide in joint preservation as he is so  relaint on upper extremities for mobility as well as for ADL function   Status On-going   OT LONG TERM GOAL #4   Title Patient will explore various adaptive equipemt options to reduce stress on UE joints for ADL/IADL   Status On-going               Plan - 02/25/16 1416    Clinical Impression Statement Pt is progressing towards goals for LUE strength.   Pt will benefit from skilled therapeutic intervention in order to improve on the following deficits (Retired) Abnormal gait;Decreased balance;Decreased coordination;Decreased knowledge of use of DME;Decreased mobility;Decreased strength;Decreased range of motion;Impaired UE functional use;Pain   Rehab Potential Good   OT Frequency 2x / week   OT Duration 8 weeks   OT Treatment/Interventions Self-care/ADL training;Moist Heat;Therapeutic exercise;Manual Therapy;DME and/or AE instruction;Patient/family education;Therapeutic activities;Balance training   Plan continue to progress UE functional use and strength.   Consulted and Agree with Plan of Care Patient         Problem List Patient Active Problem List   Diagnosis Date Noted  . HTN (hypertension) 07/05/2014  . Hearing loss of both ears 03/25/2013  . Cerebral palsy ([place]) 03/25/2013    Bianna Haran 02/25/2016, 2:22 PM  [facility] [address] [place], [place], [address] Phone: [phone]   Fax:  [phone]  Name: Dustin Mora MRN: [id number] Date of Birth: 08-12-1961  Exercises    Exercises  Shoulder;Elbow    Shoulder Exercises: Supine    Flexion  AROM;AAROM;Strengthening      Shoulder Flexion Weight (lbs) 2 sets x 10 reps 3    ABduction  Shoulder ABduction Weight AA/ROM with cane 5 reps, discontinue due to pain    Other Supine Exercises  biceps curls 2 sets of 10 reps with 4lbs, triceps extension 2 sets of 10 reps with 3 lbs while heat applied to left shoulder x 10 mins, no adverse reactions  Arm bike level 3 x 5 mins level 3 for conditioning  Therapsit had pt simulate drying his back with therapist providing suggestions regarding techniques, pt was instructed in long handled sponge for bathing back and pt was told where he could purchase.

## 2016-02-27 ENCOUNTER — Encounter: Payer: Self-pay | Admitting: Occupational Therapy

## 2016-02-27 ENCOUNTER — Ambulatory Visit: Payer: Medicare HMO | Admitting: Occupational Therapy

## 2016-02-27 DIAGNOSIS — M25512 Pain in left shoulder: Secondary | ICD-10-CM | POA: Diagnosis not present

## 2016-02-27 DIAGNOSIS — R29898 Other symptoms and signs involving the musculoskeletal system: Secondary | ICD-10-CM

## 2016-02-27 NOTE — Therapy (Signed)
[facility] [address] [place], [place], [address] Phone: [phone]   Fax:  [phone]  Occupational Therapy Treatment  Patient Details  Name: Dustin Mora MRN: [id number] Date of Birth: [birth year] Referring Provider: Randall Hiss DeAN  Encounter Date: 02/27/2016      OT End of Session - 02/27/16 1643    Visit Number 6   Number of Visits 17   Date for OT Re-Evaluation 04/26/16   Authorization Type [employer] - prior authorization required   Authorization - Visit Number 6   Authorization - Number of Visits 10   OT Start Time 1448   OT Stop Time 1530   OT Time Calculation (min) 42 min   Activity Tolerance Patient tolerated treatment well   Behavior During Therapy [facility] for tasks assessed/performed      Past Medical History  Diagnosis Date  . Hypertension   . Hammer toe     right  . Hearing loss of both ears     Pt has bilateral hearing aids  . Swollen L knee   . Arthritis   . Cerebral palsy [facility])     Past Surgical History  Procedure Laterality Date  . Foot surgery      due to CP  . Colonoscopy  05/01/2011    diverticulosis.  Brodie.    There were no vitals filed for this visit.  Visit Diagnosis:  Weakness of shoulder  Pain in joint of left shoulder      Subjective Assessment - 02/27/16 1628    Subjective  Patient reports improved strength in left shoulder   Patient Stated Goals I am really hoping to get my strength back on my dominant side   Currently in Pain? Yes   Pain Score 2    Pain Location Shoulder   Pain Orientation Posterior   Pain Descriptors / Indicators Aching   Pain Type Chronic pain   Pain Onset More than a month ago   Pain Frequency Intermittent   Aggravating Factors  weight bearing, lifting   Pain Relieving Factors rest   Effect of Pain on Daily Activities limits power in dominant arm                      OT Treatments/Exercises (OP) - 02/27/16 0001    Shoulder  Exercises: Supine   Flexion AROM;Strengthening;Left;15 reps;Weights   Shoulder Flexion Weight (lbs) 4   ABduction AROM;Strengthening;Both;15 reps;Theraband   Theraband Level (Shoulder ABduction) Level 1 (Yellow)   Shoulder Exercises: Seated   Other Seated Exercises UBE 8min level 4                OT Education - 02/27/16 1643    Education provided Yes   Education Details HEP for strengthening   Person(s) Educated Patient   Methods Explanation;Demonstration   Comprehension Verbalized understanding;Returned demonstration          OT Short Term Goals - 02/27/16 1644    OT SHORT TERM GOAL #1   Title Patient will demonstrate safe paerformance with HEP with no reported increase in pain in left UE due 3/28   Status Achieved   OT SHORT TERM GOAL #2   Title Patient will demonstrate increase in shoulder flexion to 135 degrees to reach to eye level to retrieve 3 lb object from shelf    Status Achieved   OT SHORT TERM GOAL #3   Title Patient will report pain no greater than 3/10 during repetitive motion IADL activity,  e.g. washing, drying, utting away dishes   Status Achieved           OT Long Term Goals - 02/20/16 1555    OT LONG TERM GOAL #1   Title Patient will be independent with updated HEP for stretching and strengthening dominant left UE due 4/28   Status On-going   OT LONG TERM GOAL #2   Title Patient will demonstrate at least 145 degrees of shoulder flexion to reach to high shelf to retrieve 5-10 lb object, and repeat x 5   Status On-going   OT LONG TERM GOAL #3   Title Patient will verbally identify 2-3 UE joint protection techniques to aide in joint preservation as he is so relaint on upper extremities for mobility as well as for ADL function   Status On-going   OT LONG TERM GOAL #4   Title Patient will explore various adaptive equipemt options to reduce stress on UE joints for ADL/IADL   Status On-going               Plan - 02/27/16 1644    Clinical  Impression Statement Patient is showing improved strength and conditioning in left UE.     Pt will benefit from skilled therapeutic intervention in order to improve on the following deficits (Retired) Abnormal gait;Decreased balance;Decreased coordination;Decreased knowledge of use of DME;Decreased mobility;Decreased strength;Decreased range of motion;Impaired UE functional use;Pain   Rehab Potential Good   OT Frequency 2x / week   OT Duration 8 weeks   OT Treatment/Interventions Self-care/ADL training;Moist Heat;Therapeutic exercise;Manual Therapy;DME and/or AE instruction;Patient/family education;Therapeutic activities;Balance training   Plan Progress stength and conditionin gleft UE   Consulted and Agree with Plan of Care Patient        Problem List Patient Active Problem List   Diagnosis Date Noted  . HTN (hypertension) 07/05/2014  . Hearing loss of both ears 03/25/2013  . Cerebral palsy ([place]) 03/25/2013    Mariah Milling, [name] 02/27/2016, 4:45 PM  [facility] [address] [place], [place], [address] Phone: [phone]   Fax:  [phone]  Name: Dustin Mora MRN: [id number] Date of Birth: Sep 03, 1961

## 2016-02-27 NOTE — Patient Instructions (Signed)
Yellow resistive band exercise:  Lying on your back: Wrap therapy band around both hands. Hold right arm against chest, and punch left arm out straight ahead at shoulder height. Do not allow arm to snap back. Punch out and return 10 times, repeat 3 sets.  Same position - lying on your back. Reach both arms up toward the ceiling with elbows straight. Gently move both arms away from each other with elbows straight. Do not let arms snap back - move in slow and controlled manner.   Complete 10 repetitions, and repeat 3 sets.

## 2016-03-03 ENCOUNTER — Encounter: Payer: Medicare Other | Admitting: Occupational Therapy

## 2016-03-05 ENCOUNTER — Encounter: Payer: Medicare Other | Admitting: Occupational Therapy

## 2016-10-16 ENCOUNTER — Encounter: Payer: Self-pay | Admitting: Occupational Therapy

## 2016-10-16 NOTE — Therapy (Signed)
[facility] [address] [place], [place], [address] Phone: [phone]   Fax:  [phone]  Patient Details  Name: Dustin Mora MRN: [id number] Date of Birth: 01-06-1961 Referring Provider:  No ref. provider found  Date: 10/16/2016- documentation only OCCUPATIONAL THERAPY DISCHARGE SUMMARY  Visits from Start of Care: 6 Current functional level related to goals / functional outcomes: Unable to comment as patient did not return for subsequent therapy sessions   Remaining deficits: Unable to comment as patient did not return for subsequent therapy sessions   Education / Equipment: HEP, Adaptive equipment / technique for ADL Plan:                                                    Patient goals were not met. Patient is being discharged due to the patient's request.  ?????       Mariah Milling, [name]  10/16/2016, 11:46 AM  [name] [address] [place], [place], [address] Phone: [phone]   Fax:  [phone]

## 2016-11-28 ENCOUNTER — Ambulatory Visit (INDEPENDENT_AMBULATORY_CARE_PROVIDER_SITE_OTHER): Payer: Medicare HMO | Admitting: Physician Assistant

## 2016-11-28 VITALS — BP 130/76 | HR 93 | Temp 100.2°F | Resp 16 | Ht 69.0 in | Wt 202.0 lb

## 2016-11-28 DIAGNOSIS — L089 Local infection of the skin and subcutaneous tissue, unspecified: Secondary | ICD-10-CM | POA: Diagnosis not present

## 2016-11-28 DIAGNOSIS — L723 Sebaceous cyst: Secondary | ICD-10-CM | POA: Diagnosis not present

## 2016-11-28 MED ORDER — DOXYCYCLINE HYCLATE 100 MG PO CAPS: 100.0000 mg | ORAL_CAPSULE | Freq: Two times a day (BID) | ORAL | 0 refills | Status: DC

## 2016-11-28 NOTE — Patient Instructions (Addendum)
Please fill your prescription and start taking antibiotics.   Change dressing if dressing is soiled. Keep covered at all times except when showering. You may get wet in the shower but do not scrub.  Apply warm compresses/heating pad to facilitate drainage. Leave packing in place. Do not apply any ointments as this may delay healing. If an antibiotic was prescribed, take as directed until finished. Return on Tuesday for wound care.  Thank you for coming in today. I hope you feel we met your needs.  Feel free to call [facility] if you have any questions or further requests.  Please consider signing up for MyChart if you do not already have it, as this is a great way to communicate with me.  Best,  Whitney McVey, PA-C    IF you received an x-ray today, you will receive an invoice from [facility]. Please contact [facility] at [phone] with questions or concerns regarding your invoice.   IF you received labwork today, you will receive an invoice from [facility]. Please contact [facility] at [phone] with questions or concerns regarding your invoice.   Our billing staff will not be able to assist you with questions regarding bills from these companies.  You will be contacted with the lab results as soon as they are available. The fastest way to get your results is to activate your My Chart account. Instructions are located on the last page of this paperwork. If you have not heard from [place] regarding the results in 2 weeks, please contact this office.

## 2016-11-28 NOTE — Progress Notes (Signed)
Dustin Mora  MRN: [id number] DOB: Mar 08, 1961  PCP: Rogers Blocker, MD  Subjective:  Pt is a 55 year old male PMH HTN, cerebral palsy, who presents to clinic for knot on jaw. C/o bump on the underside of his right jaw x a few months, over the past few days it has become much bigger, He was here a few years ago for the same - I&D performed at that time. He says this is the same spot. Ever since that time her has had a small bump in that area. Has not applied heat.  Denies fever, chills, SOB, jaw pain, tooth pain, drainage, chest pain.    Review of Systems  Constitutional: Negative for chills, diaphoresis and fever.  Respiratory: Negative for cough, chest tightness, shortness of breath and wheezing.   Cardiovascular: Negative for chest pain, palpitations and leg swelling.  Gastrointestinal: Negative for diarrhea, nausea and vomiting.  Skin: Positive for wound.  Neurological: Negative for dizziness, syncope, light-headedness and headaches.  Psychiatric/Behavioral: Negative for sleep disturbance. The patient is not nervous/anxious.     Patient Active Problem List   Diagnosis Date Noted  . HTN (hypertension) 07/05/2014  . Hearing loss of both ears 03/25/2013  . Cerebral palsy ([place]) 03/25/2013    Current Outpatient Prescriptions on File Prior to Visit  Medication Sig Dispense Refill  . Cholecalciferol (VITAMIN D3 PO) Take by mouth daily.    . hydrochlorothiazide 25 MG tablet Take 25 mg by mouth daily.      Marland Kitchen lisinopril (PRINIVIL,ZESTRIL) 20 MG tablet Take 20 mg by mouth daily.      . naproxen (NAPROSYN) 500 MG tablet Take 500 mg by mouth 2 (two) times daily with a meal.     . omeprazole (PRILOSEC) 40 MG capsule Take 40 mg by mouth daily.    Marland Kitchen OVER THE COUNTER MEDICATION daily.    . fluticasone (FLONASE) 50 MCG/ACT nasal spray Place 2 sprays into both nostrils daily. (Patient not taking: Reported on 11/28/2016) 16 g 0  . HYDROcodone-homatropine (HYCODAN) 5-1.5 MG/5ML syrup Take 5 mLs  by mouth at bedtime as needed for cough. (Patient not taking: Reported on 11/28/2016) 120 mL 0  . sulfamethoxazole-trimethoprim (BACTRIM DS) 800-160 MG per tablet Take 1 tablet by mouth 2 (two) times daily. (Patient not taking: Reported on 11/28/2016) 30 tablet 1   Current Facility-Administered Medications on File Prior to Visit  Medication Dose Route Frequency Provider Last Rate Last Dose  . 0.9 %  sodium chloride infusion  500 mL Intravenous Continuous Lafayette Dragon, MD        No Known Allergies   Objective:  BP 130/76   Pulse 93   Temp 100.2 F (37.9 C) (Oral)   Resp 16   Ht 5\' 9"  (1.753 m)   Wt 202 lb (91.6 kg)   SpO2 96%   BMI 29.83 kg/m   Physical Exam  Constitutional: He is oriented to person, place, and time and well-developed, well-nourished, and in no distress. No distress.  HENT:  Head:    Cardiovascular: Normal rate, regular rhythm and normal heart sounds.   Neurological: He is alert and oriented to person, place, and time. GCS score is 15.  Skin: Skin is warm and dry.  Psychiatric: Mood, memory, affect and judgment normal.  Vitals reviewed.   Procedure: Verbal consent obtained. Skin was cleaned with alcohol and anesthetized with 3cc Lidocaine with Epinephrine. No concern for involvement of deeper structures or vasculature as the abscess was about 1cm from  surface of skin.  A 1 cm incision was made. A large amount of purulent and cystic material expressed. Wound cavity size explores with loculations. Irrigated with 10cc saline.  Wound tightly  packed with 1/4  inch packing. Wound dressed and wound care discussed.  Assessment and Plan :  1. Infected sebaceous cyst - doxycycline (VIBRAMYCIN) 100 MG capsule; Take 1 capsule (100 mg total) by mouth 2 (two) times daily.  Dispense: 20 capsule; Refill: 0 - WOUND CULTURE - Discussed with patient proper home care instructions, apply heat a few times a day, start abx, RTC in 2-3 days for repacking.    Dustin Pod, PA-C    [facility] 11/28/2016 11:08 AM

## 2016-12-01 ENCOUNTER — Ambulatory Visit (INDEPENDENT_AMBULATORY_CARE_PROVIDER_SITE_OTHER): Payer: Medicare HMO | Admitting: Physician Assistant

## 2016-12-01 VITALS — Temp 98.8°F

## 2016-12-01 DIAGNOSIS — Z09 Encounter for follow-up examination after completed treatment for conditions other than malignant neoplasm: Secondary | ICD-10-CM

## 2016-12-01 DIAGNOSIS — L089 Local infection of the skin and subcutaneous tissue, unspecified: Secondary | ICD-10-CM

## 2016-12-01 DIAGNOSIS — L723 Sebaceous cyst: Secondary | ICD-10-CM

## 2016-12-01 LAB — WOUND CULTURE: Organism ID, Bacteria: NONE SEEN

## 2016-12-01 NOTE — Patient Instructions (Signed)
     IF you received an x-ray today, you will receive an invoice from [facility]. Please contact [facility] at [phone] with questions or concerns regarding your invoice.   IF you received labwork today, you will receive an invoice from [facility]. Please contact [facility] at [phone] with questions or concerns regarding your invoice.   Our billing staff will not be able to assist you with questions regarding bills from these companies.  You will be contacted with the lab results as soon as they are available. The fastest way to get your results is to activate your My Chart account. Instructions are located on the last page of this paperwork. If you have not heard from us regarding the results in 2 weeks, please contact this office.     

## 2016-12-03 ENCOUNTER — Ambulatory Visit (INDEPENDENT_AMBULATORY_CARE_PROVIDER_SITE_OTHER): Payer: Medicare HMO | Admitting: Physician Assistant

## 2016-12-03 VITALS — BP 126/80 | HR 94 | Temp 98.4°F

## 2016-12-03 DIAGNOSIS — L089 Local infection of the skin and subcutaneous tissue, unspecified: Secondary | ICD-10-CM

## 2016-12-03 DIAGNOSIS — L723 Sebaceous cyst: Secondary | ICD-10-CM

## 2016-12-03 NOTE — Progress Notes (Signed)
   12/03/2016 9:19 PM   DOB: [birth year] / MRN: [id number]  SUBJECTIVE:  [name] is a well appearing 56 y.o. here today for wound care. He denies exquisite tenderness at the site of the wound, nausea, emesis, fever and chills.  He has been compliant with medical therapy and recommendations thus far.   Review of Systems  Constitutional: Negative for fever.  Gastrointestinal: Negative for nausea.  Skin: Negative for rash.    Problem list and medications reviewed and updated by myself where necessary, and exist elsewhere in the encounter.   OBJECTIVE:  BP 126/80 (BP Location: Right Arm, Patient Position: Sitting, Cuff Size: Normal)   Pulse 94   Temp 98.4 F (36.9 C) (Oral)   SpO2 99%  CrCl cannot be calculated (Patient's most recent lab result is older than the maximum 21 days allowed.).  Physical Exam  Neck:      No results found for this or any previous visit (from the past 48 hour(s)).  ASSESSMENT AND PLAN  Dustin Mora was seen today for dressing change.  Diagnoses and all orders for this visit:  Infected sebaceous cyst Comments: Resolved.  RTC as needed.     The patient was advised to call or return to clinic if he does not see an improvement in symptoms or to seek the care of the closest emergency department if he worsens with the above plan.   Philis Fendt, MHS, PA-C [facility] 12/03/2016 9:19 PM

## 2016-12-03 NOTE — Patient Instructions (Signed)
     IF you received an x-ray today, you will receive an invoice from [facility]. Please contact [facility] at [phone] with questions or concerns regarding your invoice.   IF you received labwork today, you will receive an invoice from [facility]. Please contact [facility] at [phone] with questions or concerns regarding your invoice.   Our billing staff will not be able to assist you with questions regarding bills from these companies.  You will be contacted with the lab results as soon as they are available. The fastest way to get your results is to activate your My Chart account. Instructions are located on the last page of this paperwork. If you have not heard from us regarding the results in 2 weeks, please contact this office.     

## 2016-12-06 NOTE — Progress Notes (Signed)
[facility] [address], [place] [address] [phone]  Date:  12/01/2016   Name:  Dustin Mora   DOB:  01/20/1961   MRN:  [id number]  PCP:  Rogers Blocker, MD    History of Present Illness:  Dustin Mora is a 56 y.o. male patient who presents to [facility] for follow up of infected sebaceous cyst.  Patient was seen 2 days ago for infected sebaceous cyst sp I and D at her right chin.   No fever.  He is taking abx at this time.  Wound continues to drain.   Patient Active Problem List   Diagnosis Date Noted  . HTN (hypertension) 07/05/2014  . Hearing loss of both ears 03/25/2013  . Cerebral palsy ([place]) 03/25/2013    Past Medical History:  Diagnosis Date  . Arthritis   . Cerebral palsy ([place])   . Hammer toe    right  . Hearing loss of both ears    Pt has bilateral hearing aids  . Hypertension   . Swollen L knee     Past Surgical History:  Procedure Laterality Date  . COLONOSCOPY  05/01/2011   diverticulosis.  [place].  Marland Kitchen FOOT SURGERY     due to CP    Social History  Substance Use Topics  . Smoking status: Current Every Day Smoker    Packs/day: 0.50    Years: 15.00    Types: Cigarettes  . Smokeless tobacco: Never Used  . Alcohol use 1.5 oz/week    3 drink(s) per week    Family History  Problem Relation Age of Onset  . Prostate cancer Father     No Known Allergies  Medication list has been reviewed and updated.  Current Outpatient Prescriptions on File Prior to Visit  Medication Sig Dispense Refill  . Cholecalciferol (VITAMIN D3 PO) Take by mouth daily.    . cyclobenzaprine (FLEXERIL) 10 MG tablet Take 10 mg by mouth 3 (three) times daily as needed for muscle spasms.    Marland Kitchen doxycycline (VIBRAMYCIN) 100 MG capsule Take 1 capsule (100 mg total) by mouth 2 (two) times daily. 20 capsule 0  . fluticasone (FLONASE) 50 MCG/ACT nasal spray Place 2 sprays into both nostrils daily. 16 g 0  . hydrochlorothiazide 25 MG tablet Take 25 mg by mouth  daily.      Marland Kitchen lisinopril (PRINIVIL,ZESTRIL) 20 MG tablet Take 20 mg by mouth daily.      . naproxen (NAPROSYN) 500 MG tablet Take 500 mg by mouth 2 (two) times daily with a meal.     . omeprazole (PRILOSEC) 40 MG capsule Take 40 mg by mouth daily.    Marland Kitchen OVER THE COUNTER MEDICATION daily.     No current facility-administered medications on file prior to visit.     ROS   Physical Examination: Temp 98.8 F (37.1 C) (Oral)  Ideal Body Weight:    Physical Exam  Constitutional: He is oriented to person, place, and time. He appears well-developed and well-nourished. No distress.  HENT:  Head: Normocephalic and atraumatic.  Right side of chin with 1cm wound with about 1cm depth of wound without necrosis or purulent drainage once the packing was removed.   Eyes: Conjunctivae and EOM are normal. Pupils are equal, round, and reactive to light.  Cardiovascular: Normal rate.   Pulmonary/Chest: Effort normal. No respiratory distress.  Neurological: He is alert and oriented to person, place, and time.  Skin: Skin is warm and dry.  He is not diaphoretic.  Psychiatric: He has a normal mood and affect. His behavior is normal.     Assessment and Plan: [name] is a 56 y.o. male who is here today for follow up of infected sebaceous cyst status post I and d 2 days ago. -cleansed and irrigated.  Gently packed.  Advised to return in 2 days.  Continue abx at this time.  Await lab result.  There are no diagnoses linked to this encounter.  Ivar Drape, PA-C [facility] 12/06/2016 5:51 PM

## 2017-05-12 ENCOUNTER — Other Ambulatory Visit (HOSPITAL_COMMUNITY): Payer: Self-pay | Admitting: Internal Medicine

## 2017-05-12 ENCOUNTER — Ambulatory Visit (HOSPITAL_COMMUNITY)
Admission: RE | Admit: 2017-05-12 | Discharge: 2017-05-12 | Disposition: A | Discharge status: Home / Self Care | Payer: 59 | Source: Ambulatory Visit | Attending: Internal Medicine | Admitting: Internal Medicine

## 2017-05-12 DIAGNOSIS — Z720 Tobacco use: Secondary | ICD-10-CM | POA: Diagnosis present

## 2017-05-12 DIAGNOSIS — R0602 Shortness of breath: Secondary | ICD-10-CM

## 2018-07-15 ENCOUNTER — Ambulatory Visit (HOSPITAL_COMMUNITY)
Admission: RE | Admit: 2018-07-15 | Discharge: 2018-07-15 | Disposition: A | Discharge status: Home / Self Care | Payer: Medicare Other | Source: Ambulatory Visit | Attending: Nurse Practitioner | Admitting: Nurse Practitioner

## 2018-07-15 ENCOUNTER — Other Ambulatory Visit (HOSPITAL_COMMUNITY): Payer: Self-pay | Admitting: Nurse Practitioner

## 2018-07-15 DIAGNOSIS — S83091A Other subluxation of right patella, initial encounter: Secondary | ICD-10-CM | POA: Insufficient documentation

## 2018-07-15 DIAGNOSIS — M25561 Pain in right knee: Secondary | ICD-10-CM

## 2018-07-15 DIAGNOSIS — X58XXXA Exposure to other specified factors, initial encounter: Secondary | ICD-10-CM | POA: Diagnosis not present

## 2019-01-19 ENCOUNTER — Other Ambulatory Visit: Payer: Self-pay | Admitting: Urology

## 2019-01-19 DIAGNOSIS — N4232 Atypical small acinar proliferation of prostate: Secondary | ICD-10-CM

## 2019-02-28 ENCOUNTER — Other Ambulatory Visit: Payer: Medicare Other

## 2019-04-10 ENCOUNTER — Ambulatory Visit
Admission: RE | Admit: 2019-04-10 | Discharge: 2019-04-10 | Disposition: A | Discharge status: Home / Self Care | Payer: Medicare Other | Source: Ambulatory Visit | Attending: Urology | Admitting: Urology

## 2019-04-10 ENCOUNTER — Other Ambulatory Visit: Payer: Self-pay

## 2019-04-10 DIAGNOSIS — N4232 Atypical small acinar proliferation of prostate: Secondary | ICD-10-CM

## 2019-04-10 MED ORDER — GADOBENATE DIMEGLUMINE 529 MG/ML IV SOLN
17.0000 mL | Freq: Once | INTRAVENOUS | Status: AC | PRN
  Administered 2019-04-10: 17 mL via INTRAVENOUS

## 2019-10-09 DIAGNOSIS — I10 Essential (primary) hypertension: Secondary | ICD-10-CM | POA: Diagnosis not present

## 2019-10-17 ENCOUNTER — Other Ambulatory Visit: Payer: Self-pay

## 2019-10-17 ENCOUNTER — Ambulatory Visit (INDEPENDENT_AMBULATORY_CARE_PROVIDER_SITE_OTHER): Payer: Medicare Other | Admitting: Internal Medicine

## 2019-10-17 ENCOUNTER — Encounter: Payer: Self-pay | Admitting: Internal Medicine

## 2019-10-17 VITALS — BP 116/62 | HR 83 | Temp 98.8°F | Ht 68.0 in | Wt 202.5 lb

## 2019-10-17 DIAGNOSIS — Z0000 Encounter for general adult medical examination without abnormal findings: Secondary | ICD-10-CM | POA: Diagnosis not present

## 2019-10-17 DIAGNOSIS — R3911 Hesitancy of micturition: Secondary | ICD-10-CM | POA: Diagnosis not present

## 2019-10-17 DIAGNOSIS — I10 Essential (primary) hypertension: Secondary | ICD-10-CM

## 2019-10-17 DIAGNOSIS — Z79899 Other long term (current) drug therapy: Secondary | ICD-10-CM | POA: Diagnosis not present

## 2019-10-17 DIAGNOSIS — F17210 Nicotine dependence, cigarettes, uncomplicated: Secondary | ICD-10-CM

## 2019-10-17 LAB — COMPREHENSIVE METABOLIC PANEL
ALT: 20 U/L (ref 0–44)
AST: 19 U/L (ref 15–41)
Albumin: 3.8 g/dL (ref 3.5–5.0)
Alkaline Phosphatase: 66 U/L (ref 38–126)
Anion gap: 8 (ref 5–15)
BUN: 16 mg/dL (ref 6–20)
CO2: 25 mmol/L (ref 22–32)
Calcium: 8.9 mg/dL (ref 8.9–10.3)
Chloride: 104 mmol/L (ref 98–111)
Creatinine, Ser: 1.09 mg/dL (ref 0.61–1.24)
GFR calc Af Amer: >60 mL/min (ref >60)
GFR calc non Af Amer: >60 mL/min (ref >60)
Glucose, Bld: 93 mg/dL (ref 70–99)
Potassium: 3.6 mmol/L (ref 3.5–5.1)
Sodium: 137 mmol/L (ref 135–145)
Total Bilirubin: 1.3 mg/dL — ABNORMAL HIGH (ref 0.3–1.2)
Total Protein: 7 g/dL (ref 6.5–8.1)

## 2019-10-17 LAB — CBC
HCT: 47.6 % (ref 39.0–52.0)
Hemoglobin: 15.4 g/dL (ref 13.0–17.0)
MCH: 31.4 pg (ref 26.0–34.0)
MCHC: 32.4 g/dL (ref 30.0–36.0)
MCV: 96.9 fL (ref 80.0–100.0)
Platelets: 256 10*3/uL (ref 150–400)
RBC: 4.91 MIL/uL (ref 4.22–5.81)
RDW: 12.4 % (ref 11.5–15.5)
WBC: 9.5 10*3/uL (ref 4.0–10.5)
nRBC: 0 % (ref 0.0–0.2)

## 2019-10-17 NOTE — Patient Instructions (Addendum)
Dustin Mora,  It was a pleasure meeting you this morning! Today we spoke about your general health and hypertension. Please continue taking your medications as prescribed. We will collect blood today to evaluate your blood levels and kidney function. We look forward to continue working with you, we will see you in 3 months time.  Sincerely,  Maudie Mercury, MD

## 2019-10-17 NOTE — Progress Notes (Signed)
CC: High Blood Pressure  HPI:  [name] is a 58 y.o., with a PMH noted below, who presents to the clinic to establish care and hypertension management. To see the management of his acute and chronic conditions, please see the A&P Note in the encounters tab.   Past Medical History:  Diagnosis Date  . Arthritis   . Cerebral palsy ([place])   . Hammer toe    right  . Hearing loss of both ears    Pt has bilateral hearing aids  . Hypertension   . Swollen L knee    Social History:  - Lives at home without roommate  - Endorses good appetite, states that he eats more meat than vegetables.  - Patient endorses 1/3 pack of cigarettes a day for 25 years. Is not interested in cessation at this time.  - Has 3-4 EtOH beverages a week.  - Does not endorse illicit drug usage.   Past Surgical History:  - Ankle repair when he was a teenager.   Hospitalizations: - Patient does not endorse previous hospitalizations.   Family History:  - No FM of DM - HTN: Father - Cancer: Father had either bladder/prostate - Denies family history of heart disease or stroke. - No family history of blood disorders.  Review of Systems:   Review of Systems  Constitutional: Negative for chills, diaphoresis, fever and malaise/fatigue.  HENT: Negative for hearing loss.   Eyes: Negative for blurred vision.  Respiratory: Negative for cough and hemoptysis.   Cardiovascular: Negative for chest pain and palpitations.  Gastrointestinal: Negative for abdominal pain, blood in stool, constipation, diarrhea, heartburn, nausea and vomiting.  Genitourinary: Negative for dysuria, frequency and urgency.       Patient states that he has some urinary hesitancy because he holds his urine for extended periods of time. Is able to void, but does have to come back to completely empty his bladder.  Musculoskeletal: Negative for myalgias.  Neurological: Negative for dizziness, tremors and headaches.  Endo/Heme/Allergies:      Patient endorses a hematoma from a fall in September.      Physical Exam:  Vitals:   10/17/19 0916  BP: 116/62  Pulse: 83  Temp: 98.8 F (37.1 C)  TempSrc: Oral  SpO2: 97%  Weight: 202 lb 8 oz (91.9 kg)  Height: 5\' 8"  (1.727 m)   Physical Exam Constitutional:      General: He is not in acute distress.    Appearance: Normal appearance. He is normal weight. He is not ill-appearing or toxic-appearing.  HENT:     Head: Normocephalic and atraumatic.     Right Ear: External ear normal.     Left Ear: External ear normal.     Nose: Nose normal.  Eyes:     Conjunctiva/sclera: Conjunctivae normal.  Cardiovascular:     Rate and Rhythm: Normal rate and regular rhythm.     Pulses: Normal pulses.     Heart sounds: No murmur. No friction rub. No gallop.   Pulmonary:     Effort: Pulmonary effort is normal.     Breath sounds: Normal breath sounds. No wheezing, rhonchi or rales.  Abdominal:     General: Abdomen is flat. Bowel sounds are normal.     Palpations: Abdomen is soft.     Tenderness: There is no abdominal tenderness. There is no guarding.  Musculoskeletal:        General: Tenderness present.     Right lower leg: Edema present.  Left lower leg: Edema present.     Comments: Hematoma on the anterolateral aspect of left lower extremity. Tender to palpation. 2+ pitting edema.  Skin:    General: Skin is warm.  Neurological:     Mental Status: He is alert and oriented to person, place, and time.  Psychiatric:        Mood and Affect: Mood normal.        Behavior: Behavior normal.     Assessment & Plan:   See Encounters Tab for problem based charting.  Patient seen with Dr. Daryll Drown

## 2019-10-18 NOTE — Assessment & Plan Note (Signed)
Patient comes to the clinic to establish care and for management of his HTN. Patient's BP on arrival was 116/62 with a pulse of 83. He is currently on lisinopril 20 mg QD and HCTZ 25 mg QD for his HTN.   Plan:  - Continue home medications - Continue to monitor vitals - BMP to assess kidney function

## 2019-10-24 NOTE — Progress Notes (Signed)
Internal Medicine Clinic Attending  I saw and evaluated the patient.  I personally confirmed the key portions of the history and exam documented by Dr. Winters and I reviewed pertinent patient test results.  The assessment, diagnosis, and plan were formulated together and I agree with the documentation in the resident's note.  

## 2020-02-01 ENCOUNTER — Other Ambulatory Visit: Payer: Self-pay

## 2020-02-01 ENCOUNTER — Ambulatory Visit (INDEPENDENT_AMBULATORY_CARE_PROVIDER_SITE_OTHER): Payer: Medicare Other | Admitting: Internal Medicine

## 2020-02-01 DIAGNOSIS — Z760 Encounter for issue of repeat prescription: Secondary | ICD-10-CM | POA: Diagnosis not present

## 2020-02-01 DIAGNOSIS — I10 Essential (primary) hypertension: Secondary | ICD-10-CM

## 2020-02-01 NOTE — Progress Notes (Signed)
I connected with  Dustin Mora on 02/01/20 by a video enabled telemedicine application and verified that I am speaking with the correct person using two identifiers.   I discussed the limitations of evaluation and management by telemedicine. The patient expressed understanding and agreed to proceed.    CC: HTN follow up  This is a telephone encounter between Dustin Mora and Dustin Mora on 02/01/2020 for HTN follow and medication refill. The visit was conducted with the patient located at home and Dustin Mor[facility] at [facility]. The patient's identity was confirmed using their DOB and current address. The patient has consented to being evaluated through a telephone encounter and understands the associated risks (an examination cannot be done and the patient may need to come in for an appointment) / benefits (allows the patient to remain at home, decreasing exposure to coronavirus). I personally spent 12 minutes on medical discussion.   HPI:  Dustin Mora is a 59 y.o. with PMH as below.   Please see A&P for assessment of the patient's acute and chronic medical conditions.   Past Medical History:  Diagnosis Date  . Arthritis   . Cerebral palsy ([place])   . Hammer toe    right  . Hearing loss of both ears    Pt has bilateral hearing aids  . Hypertension   . Swollen L knee    Review of Systems:  All review of systems are negative except what is noted on the HPI.   Assessment & Plan:   See Encounters Tab for problem based charting.  Patient discussed with Dr. Dareen Piano

## 2020-02-12 ENCOUNTER — Encounter: Payer: Self-pay | Admitting: Internal Medicine

## 2020-02-12 MED ORDER — OMEPRAZOLE 40 MG PO CPDR: 40.0000 mg | DELAYED_RELEASE_CAPSULE | Freq: Every day | ORAL | 0 refills | Status: DC

## 2020-02-12 MED ORDER — HYDROCHLOROTHIAZIDE 25 MG PO TABS: 25.0000 mg | ORAL_TABLET | Freq: Every day | ORAL | 3 refills | Status: DC

## 2020-02-12 MED ORDER — LISINOPRIL 20 MG PO TABS: 20.0000 mg | ORAL_TABLET | Freq: Every day | ORAL | 3 refills | Status: DC

## 2020-02-12 NOTE — Assessment & Plan Note (Signed)
Patient presents today for a telehealth visit to discuss his HTN. He states that he is tolerating the medications well.  He does not check his blood pressure at home.  Patient states that he needs refills for his medications.  Counseled on the importance of taking his blood pressure medications and trying to check his blood pressure daily so that we can titrate his medications at his next visit.  Plan:  -Continue antihypertensive medications

## 2020-02-13 NOTE — Progress Notes (Signed)
Internal Medicine Clinic Attending  Case discussed with Dr. Coe at the time of the visit.  We reviewed the resident's history and exam and pertinent patient test results.  I agree with the assessment, diagnosis, and plan of care documented in the resident's note.    

## 2020-03-14 ENCOUNTER — Ambulatory Visit: Payer: Medicare Other | Attending: Internal Medicine

## 2020-03-14 DIAGNOSIS — Z23 Encounter for immunization: Secondary | ICD-10-CM

## 2020-03-14 NOTE — Progress Notes (Signed)
   Covid-19 Vaccination Clinic  Name:  MARELL HIRANI    MRN: [id number] DOB: [birth year]  03/14/2020  Mr. Kurkowski was observed post Covid-19 immunization for 15 minutes without incident. He was provided with Vaccine Information Sheet and instruction to access the V-Safe system.   Mr. Ariaz was instructed to call 911 with any severe reactions post vaccine: Marland Kitchen Difficulty breathing  . Swelling of face and throat  . A fast heartbeat  . A bad rash all over body  . Dizziness and weakness   Immunizations Administered    Name Date Dose VIS Date Route   Pfizer COVID-19 Vaccine 03/14/2020  1:39 PM 0.3 mL 11/10/2019 Intramuscular   Manufacturer: [place]   Lot: [id number]   [place]: [id number]

## 2020-04-09 ENCOUNTER — Ambulatory Visit: Payer: Medicare Other | Attending: Internal Medicine

## 2020-04-09 DIAGNOSIS — Z23 Encounter for immunization: Secondary | ICD-10-CM

## 2020-04-09 NOTE — Progress Notes (Signed)
   Covid-19 Vaccination Clinic  Name:  Dustin Mora    MRN: [id number] DOB: Sep 08, 1961  04/09/2020  [name] was observed post Covid-19 immunization for 15 minutes without incident. He was provided with Vaccine Information Sheet and instruction to access the V-Safe system.   [name] was instructed to call 911 with any severe reactions post vaccine: Marland Kitchen Difficulty breathing  . Swelling of face and throat  . A fast heartbeat  . A bad rash all over body  . Dizziness and weakness   Immunizations Administered    Name Date Dose VIS Date Route   Pfizer COVID-19 Vaccine 04/09/2020  2:02 PM 0.3 mL 01/24/2019 Intramuscular   Manufacturer: [place]   Lot: [id number]   [place]: [id number]

## 2020-05-09 ENCOUNTER — Other Ambulatory Visit: Payer: Self-pay

## 2020-05-09 ENCOUNTER — Ambulatory Visit (INDEPENDENT_AMBULATORY_CARE_PROVIDER_SITE_OTHER): Payer: Medicare Other | Admitting: Internal Medicine

## 2020-05-09 VITALS — BP 116/78 | HR 77 | Temp 98.4°F | Wt 193.0 lb

## 2020-05-09 DIAGNOSIS — D1724 Benign lipomatous neoplasm of skin and subcutaneous tissue of left leg: Secondary | ICD-10-CM | POA: Diagnosis not present

## 2020-05-09 DIAGNOSIS — I10 Essential (primary) hypertension: Secondary | ICD-10-CM | POA: Diagnosis not present

## 2020-05-09 NOTE — Patient Instructions (Signed)
Thank you, [name] for allowing [place] to provide your care today. Today we discussed Blood pressure, lipoma.    I have ordered none labs for you. I will call if any are abnormal.    I have place a referrals to none.   I have ordered the following tests: none   I have ordered the following medication/changed the following medications: none   Please follow-up in 6 months.    Should you have any questions or concerns please call the internal medicine clinic at [phone].    Marianna Payment, D.O. [facility]

## 2020-05-13 DIAGNOSIS — D179 Benign lipomatous neoplasm, unspecified: Secondary | ICD-10-CM | POA: Insufficient documentation

## 2020-05-13 NOTE — Assessment & Plan Note (Signed)
Patient presents with a concern regarding a lump on the side of his leg. The lump is roughly 4-6 cm in diameter on the lateral aspect of his left calf. It is soft, non-fluctuant and freely mobile. [place] was used to image the lump and it was consistent with a lipoma.    Plan: - I counseled him regarding the benign nature and informed him of red flag signs to be aware of in the future.

## 2020-05-13 NOTE — Assessment & Plan Note (Signed)
Patient presents for a HTN follow up appointment. He is currently taking lisinopril 20 mg, HCTZ 25 mg and his blood pressure is 116/78 mm Hg.  Plan: - His blood pressure is well controlled.  - Continue current regimen.

## 2020-05-13 NOTE — Progress Notes (Signed)
CC: HTN  HPI:  [name] is a 59 y.o. male  with a past medical history stated below and presents today for HTN. Please see problem based assessment and plan for additional details.  Past Medical History:  Diagnosis Date  . Arthritis   . Cerebral palsy ([place])   . Hammer toe    right  . Hearing loss of both ears    Pt has bilateral hearing aids  . Hypertension   . Swollen L knee     Current Outpatient Medications on File Prior to Visit  Medication Sig Dispense Refill  . Cholecalciferol (VITAMIN D3 PO) Take by mouth daily.    . fluticasone (FLONASE) 50 MCG/ACT nasal spray Place 2 sprays into both nostrils daily. 16 g 0  . hydrochlorothiazide (HYDRODIURIL) 25 MG tablet Take 1 tablet (25 mg total) by mouth daily. 30 tablet 3  . lisinopril (ZESTRIL) 20 MG tablet Take 1 tablet (20 mg total) by mouth daily. 30 tablet 3  . naproxen (NAPROSYN) 500 MG tablet Take 500 mg by mouth 2 (two) times daily with a meal.     . omeprazole (PRILOSEC) 40 MG capsule Take 1 capsule (40 mg total) by mouth daily. 30 capsule 0   No current facility-administered medications on file prior to visit.    Family History  Problem Relation Age of Onset  . Prostate cancer Father     Social History   Socioeconomic History  . Marital status: Single    Spouse name: Not on file  . Number of children: Not on file  . Years of education: Not on file  . Highest education level: Not on file  Occupational History  . Not on file  Tobacco Use  . Smoking status: Current Every Day Smoker    Packs/day: 0.50    Years: 15.00    Pack years: 7.50    Types: Cigarettes  . Smokeless tobacco: Never Used  . Tobacco comment: Smoking 8 cigs per day  Substance and Sexual Activity  . Alcohol use: Yes    Alcohol/week: 3.0 standard drinks    Types: 3 Standard drinks or equivalent per week  . Drug use: No  . Sexual activity: Not Currently  Other Topics Concern  . Not on file  Social History Narrative  . Not  on file   Social Determinants of Health   Financial Resource Strain:   . Difficulty of Paying Living Expenses:   Food Insecurity:   . Worried About Charity fundraiser in the Last Year:   . Arboriculturist in the Last Year:   Transportation Needs:   . Film/video editor (Medical):   Marland Kitchen Lack of Transportation (Non-Medical):   Physical Activity:   . Days of Exercise per Week:   . Minutes of Exercise per Session:   Stress:   . Feeling of Stress :   Social Connections:   . Frequency of Communication with Friends and Family:   . Frequency of Social Gatherings with Friends and Family:   . Attends Religious Services:   . Active Member of Clubs or Organizations:   . Attends Archivist Meetings:   Marland Kitchen Marital Status:   Intimate Partner Violence:   . Fear of Current or Ex-Partner:   . Emotionally Abused:   Marland Kitchen Physically Abused:   . Sexually Abused:     Review of Systems: ROS negative except for what is noted on the assessment and plan.  Vitals:   05/09/20 1646  BP: 116/78  Pulse: 77  Temp: 98.4 F (36.9 C)  TempSrc: Oral  SpO2: 99%  Weight: 193 lb (87.5 kg)     Physical Exam: Physical Exam Constitutional:      Appearance: Normal appearance.  HENT:     Head: Normocephalic and atraumatic.  Eyes:     Extraocular Movements: Extraocular movements intact.  Cardiovascular:     Rate and Rhythm: Normal rate.     Pulses: Normal pulses.     Heart sounds: Normal heart sounds.  Pulmonary:     Effort: Pulmonary effort is normal.     Breath sounds: Normal breath sounds.  Abdominal:     General: Bowel sounds are normal.     Palpations: Abdomen is soft.     Tenderness: There is no abdominal tenderness.  Musculoskeletal:        General: Normal range of motion.     Cervical back: Normal range of motion.     Right lower leg: No edema.     Left lower leg: No edema.  Skin:    General: Skin is warm and dry.     Findings: Lesion (left lateral calf) present.    Neurological:     Mental Status: He is alert and oriented to person, place, and time. Mental status is at baseline.  Psychiatric:        Mood and Affect: Mood normal.      Assessment & Plan:   See Encounters Tab for problem based charting.  Patient discussed with Dr. Karie Schwalbe, D.O. [facility], PGY-1 Pager: [phone], Phone: [phone] Date 05/13/2020 Time 8:48 PM

## 2020-05-14 NOTE — Progress Notes (Signed)
Internal Medicine Clinic Attending  Case discussed with Dr. Coe at the time of the visit.  We reviewed the resident's history and exam and pertinent patient test results.  I agree with the assessment, diagnosis, and plan of care documented in the resident's note.    

## 2020-08-02 ENCOUNTER — Other Ambulatory Visit: Payer: Self-pay | Admitting: *Deleted

## 2020-08-02 MED ORDER — FLUTICASONE PROPIONATE 50 MCG/ACT NA SUSP: 2.0000 | Freq: Every day | NASAL | 0 refills | Status: DC

## 2020-08-02 MED ORDER — OMEPRAZOLE 40 MG PO CPDR: 40.0000 mg | DELAYED_RELEASE_CAPSULE | Freq: Every day | ORAL | 0 refills | Status: DC

## 2020-08-02 NOTE — Telephone Encounter (Signed)
Requesting refill on Omeprazole and Flonase.

## 2020-09-05 ENCOUNTER — Other Ambulatory Visit: Payer: Self-pay | Admitting: Internal Medicine

## 2020-09-05 ENCOUNTER — Other Ambulatory Visit: Payer: Self-pay

## 2020-09-05 ENCOUNTER — Ambulatory Visit (INDEPENDENT_AMBULATORY_CARE_PROVIDER_SITE_OTHER): Payer: Medicare Other | Admitting: Internal Medicine

## 2020-09-05 VITALS — BP 117/74 | HR 76 | Wt 191.1 lb

## 2020-09-05 DIAGNOSIS — E782 Mixed hyperlipidemia: Secondary | ICD-10-CM

## 2020-09-05 DIAGNOSIS — Z0001 Encounter for general adult medical examination with abnormal findings: Secondary | ICD-10-CM | POA: Diagnosis not present

## 2020-09-05 DIAGNOSIS — Z23 Encounter for immunization: Secondary | ICD-10-CM | POA: Diagnosis not present

## 2020-09-05 DIAGNOSIS — G802 Spastic hemiplegic cerebral palsy: Secondary | ICD-10-CM

## 2020-09-05 DIAGNOSIS — F17200 Nicotine dependence, unspecified, uncomplicated: Secondary | ICD-10-CM

## 2020-09-05 DIAGNOSIS — I10 Essential (primary) hypertension: Secondary | ICD-10-CM

## 2020-09-05 DIAGNOSIS — Z0000 Encounter for general adult medical examination without abnormal findings: Secondary | ICD-10-CM

## 2020-09-05 DIAGNOSIS — F17210 Nicotine dependence, cigarettes, uncomplicated: Secondary | ICD-10-CM

## 2020-09-05 LAB — POCT GLYCOSYLATED HEMOGLOBIN (HGB A1C): Hemoglobin A1C: 4.6 % (ref 4.0–5.6)

## 2020-09-05 LAB — GLUCOSE, CAPILLARY: Glucose-Capillary: 86 mg/dL (ref 70–99)

## 2020-09-05 MED ORDER — ACETAMINOPHEN 500 MG PO TABS: 1000.0000 mg | ORAL_TABLET | Freq: Three times a day (TID) | ORAL | 2 refills | Status: AC

## 2020-09-05 MED ORDER — NAPROXEN 375 MG PO TABS: 375.0000 mg | ORAL_TABLET | Freq: Every day | ORAL | 2 refills | Status: AC

## 2020-09-05 NOTE — Patient Instructions (Signed)
Thank you, Mr.Harding E Brandl for allowing [place] to provide your care today. Today we discussed blood pressure, pain medications.    I have ordered the following labs for you:   Lab Orders     Hepatitis C antibody     Lipid Profile     CMP14 + Anion Gap     POC Hbg A1C   I will call if any are abnormal. All of your labs can be accessed through "My Chart".  I have place a referrals to none.  I have ordered the following tests: none   I have ordered the following medication/changed the following medications:  1. decrease to Naproxen to 325 daily  2. begin Acetaminophen 1,000 mg three time daily   Please follow-up in  2 weeks.  Should you have any questions or concerns please call the internal medicine clinic at [phone].    Marianna Payment, D.O. [facility]   My Chart Access: [email or web address]   If you have not already done so, please get your COVID 19 vaccine  To schedule an appointment for a COVID vaccine choice any of the following: Go to [email or web address]   Go to [email or web address]                  Call [phone]                                     Call [phone] and select Option 2

## 2020-09-05 NOTE — Progress Notes (Signed)
CC: HTN  HPI:  [name] is a 59 y.o. male with a past medical history stated below and presents today for HTN. Please see problem based assessment and plan for additional details.  Past Medical History:  Diagnosis Date  . Arthritis   . Cerebral palsy ([place])   . Hammer toe    right  . Hearing loss of both ears    Pt has bilateral hearing aids  . Hypertension   . Swollen L knee     Current Outpatient Medications on File Prior to Visit  Medication Sig Dispense Refill  . Cholecalciferol (VITAMIN D3 PO) Take by mouth daily.    . fluticasone (FLONASE) 50 MCG/ACT nasal spray Place 2 sprays into both nostrils daily. 16 g 0  . hydrochlorothiazide (HYDRODIURIL) 25 MG tablet Take 1 tablet (25 mg total) by mouth daily. 30 tablet 3  . lisinopril (ZESTRIL) 20 MG tablet Take 1 tablet (20 mg total) by mouth daily. 30 tablet 3  . omeprazole (PRILOSEC) 40 MG capsule Take 1 capsule (40 mg total) by mouth daily. 30 capsule 0   No current facility-administered medications on file prior to visit.    Family History  Problem Relation Age of Onset  . Prostate cancer Father     Social History   Socioeconomic History  . Marital status: Single    Spouse name: Not on file  . Number of children: Not on file  . Years of education: Not on file  . Highest education level: Not on file  Occupational History  . Not on file  Tobacco Use  . Smoking status: Current Every Day Smoker    Packs/day: 0.50    Years: 15.00    Pack years: 7.50    Types: Cigarettes  . Smokeless tobacco: Never Used  . Tobacco comment: Smoking 8 cigs per day  Substance and Sexual Activity  . Alcohol use: Yes    Alcohol/week: 3.0 standard drinks    Types: 3 Standard drinks or equivalent per week  . Drug use: No  . Sexual activity: Not Currently  Other Topics Concern  . Not on file  Social History Narrative  . Not on file   Social Determinants of Health   Financial Resource Strain:   . Difficulty of Paying  Living Expenses: Not on file  Food Insecurity:   . Worried About Charity fundraiser in the Last Year: Not on file  . Ran Out of Food in the Last Year: Not on file  Transportation Needs:   . Lack of Transportation (Medical): Not on file  . Lack of Transportation (Non-Medical): Not on file  Physical Activity:   . Days of Exercise per Week: Not on file  . Minutes of Exercise per Session: Not on file  Stress:   . Feeling of Stress : Not on file  Social Connections:   . Frequency of Communication with Friends and Family: Not on file  . Frequency of Social Gatherings with Friends and Family: Not on file  . Attends Religious Services: Not on file  . Active Member of Clubs or Organizations: Not on file  . Attends Archivist Meetings: Not on file  . Marital Status: Not on file  Intimate Partner Violence:   . Fear of Current or Ex-Partner: Not on file  . Emotionally Abused: Not on file  . Physically Abused: Not on file  . Sexually Abused: Not on file    Review of Systems: ROS negative except for what  is noted on the assessment and plan.  Vitals:   09/05/20 1503  BP: 117/74  Pulse: 76  SpO2: 97%  Weight: 191 lb 1.6 oz (86.7 kg)     Physical Exam: Physical Exam Constitutional:      Appearance: Normal appearance.  HENT:     Head: Normocephalic and atraumatic.  Cardiovascular:     Rate and Rhythm: Normal rate.     Pulses: Normal pulses.     Heart sounds: Normal heart sounds.  Pulmonary:     Effort: Pulmonary effort is normal.     Breath sounds: Normal breath sounds.  Abdominal:     General: Bowel sounds are normal.     Palpations: Abdomen is soft.     Tenderness: There is no abdominal tenderness.  Musculoskeletal:        General: Normal range of motion.     Cervical back: Normal range of motion.     Comments: Trace edema to bilateral lower extremities.  Skin:    General: Skin is warm and dry.  Neurological:     Mental Status: He is alert and oriented to  person, place, and time. Mental status is at baseline.     Gait: Gait abnormal.     Comments: Spastic paralysis bilateral lower extremities without sensory deficits.   Psychiatric:        Mood and Affect: Mood normal.      Assessment & Plan:   See Encounters Tab for problem based charting.  Patient discussed with Dr. Bertell Maria, D.O. [facility], PGY-2 Pager: [phone], Phone: [phone] Date 09/06/2020 Time 2:43 PM

## 2020-09-06 ENCOUNTER — Encounter: Payer: Self-pay | Admitting: Internal Medicine

## 2020-09-06 DIAGNOSIS — E785 Hyperlipidemia, unspecified: Secondary | ICD-10-CM | POA: Insufficient documentation

## 2020-09-06 DIAGNOSIS — Z0000 Encounter for general adult medical examination without abnormal findings: Secondary | ICD-10-CM | POA: Insufficient documentation

## 2020-09-06 DIAGNOSIS — F17200 Nicotine dependence, unspecified, uncomplicated: Secondary | ICD-10-CM | POA: Insufficient documentation

## 2020-09-06 LAB — LIPID PANEL
Chol/HDL Ratio: 4.6 ratio (ref 0.0–5.0)
Cholesterol, Total: 180 mg/dL (ref 100–199)
HDL: 39 mg/dL — ABNORMAL LOW (ref >39)
LDL Chol Calc (NIH): 95 mg/dL (ref 0–99)
Triglycerides: 273 mg/dL — ABNORMAL HIGH (ref 0–149)
VLDL Cholesterol Cal: 46 mg/dL — ABNORMAL HIGH (ref 5–40)

## 2020-09-06 LAB — CMP14 + ANION GAP
ALT: 15 IU/L (ref 0–44)
AST: 17 IU/L (ref 0–40)
Albumin/Globulin Ratio: 1.7 (ref 1.2–2.2)
Albumin: 4.3 g/dL (ref 3.8–4.9)
Alkaline Phosphatase: 76 IU/L (ref 44–121)
Anion Gap: 14 mmol/L (ref 10.0–18.0)
BUN/Creatinine Ratio: 21 (ref 10–24)
BUN: 23 mg/dL (ref 8–27)
Bilirubin Total: 0.4 mg/dL (ref 0.0–1.2)
CO2: 22 mmol/L (ref 20–29)
Calcium: 9.7 mg/dL (ref 8.6–10.2)
Chloride: 103 mmol/L (ref 96–106)
Creatinine, Ser: 1.08 mg/dL (ref 0.76–1.27)
GFR calc Af Amer: 86 mL/min/{1.73_m2} (ref >59)
GFR calc non Af Amer: 74 mL/min/{1.73_m2} (ref >59)
Globulin, Total: 2.6 g/dL (ref 1.5–4.5)
Glucose: 79 mg/dL (ref 65–99)
Potassium: 4.6 mmol/L (ref 3.5–5.2)
Sodium: 139 mmol/L (ref 134–144)
Total Protein: 6.9 g/dL (ref 6.0–8.5)

## 2020-09-06 LAB — HEPATITIS C ANTIBODY: Hep C Virus Ab: <0.1 s/co ratio (ref 0.0–0.9)

## 2020-09-06 NOTE — Assessment & Plan Note (Signed)
Patient will get HCV test today and flu shot.

## 2020-09-06 NOTE — Assessment & Plan Note (Signed)
Patient has a history of cerebral palsy with spastic diplegia on tizanidine 4 mg every 8 hours as needed and naproxen 500 mg twice daily as needed.  Patient continues to have muscle spasms and muscle pain despite current medications.  Patient states that he is been taking naproxen for roughly 5 years without any complications.  He denies ever having GI bleed or peptic ulcer disease.  Patient states that his pain is not present controlled but can tell the difference when he does not take his current pain medications.  Patient lives alone and identifies independence as a major motivator for him.  He states that he is able to perform his ADLs and IADLs, although some days are harder than others secondary to pain and spasticity of his lower extremities.  I counseled the patient on using a variety of different medications to control his symptoms and decrease his risk of complications.  Through shared decision making, we decided to decrease his naproxen to 375 mg daily and start acetaminophen 1000 mg 3 times daily as needed for pain.  We will continue the tizanidine 4 mg every 8 hours as needed.  I will see him back in 2 weeks to reevaluate his pain.  He will likely need the addition of neuropathic pain medication such as gabapentin or duloxetine in the future.  Plan: -Decrease naproxen to 375 mg daily as needed -Start acetaminophen 1000 mg 3 times daily as needed -Continue tizanidine 4 mg every 8 hours as needed

## 2020-09-06 NOTE — Assessment & Plan Note (Signed)
Patient states that he smokes 0.5 packs/day for 30 years (15 pack-year history). He has been in the pre-contemplation stage of quitting prior to today's appointment. He is not whiling to quit smoking in the next 30 days. He  believes his barriers to quitting are the following: enjoys smoking. Patient was was unable to set a smoking cessation date at this appointment but was counseled on the importance of setting a date in the near future.   Plan: I advised patient to quit, and offered support. Discussed current use pattern. Asked patient to inform me when they set a quit date. offered nicotine replacement and pharmacological therapy

## 2020-09-06 NOTE — Assessment & Plan Note (Addendum)
Patient has a history of mixed hyperlipidemia with elevated total cholesterol and LDL.  He has not been on statin therapy.  He does have increased risk for cardiovascular disease including hypertension on medications, age, and tobacco use disorder.  I will screen him for diabetes today with a point-of-care hemoglobin A1c.  Plan: - Repeat Lipid panel today -We will need to consider starting statin medication in the future. - Hgb A1c today

## 2020-09-06 NOTE — Assessment & Plan Note (Signed)
Patient presents today for further evaluation and management of his HTN. His blood pressure is controlled on lisinopril 20 mg daily, and hydrochlorothiazide 25 mg daily. He admits to being compliant with his antihypertensive medications. He  denies abdominal discomfort, dry cough, orthostatic lightheadedness, palpitations, weakness and wheezing.   Cardiovascular risk factors: advanced age (older than 41 for men, 43 for women), dyslipidemia, hypertension, male gender, sedentary lifestyle and smoking/ tobacco exposure. Use of agents associated with hypertension: NSAIDS. History of target organ damage: none. He is not exercising, is not adherent to a low-salt diet, and is not checking his blood pressure at home.   Current Blood pressure for today's visit is listed below:  Blood Pressure 09/05/2020 05/09/2020 10/17/2019  BP 117/74 116/78 116/62  Some recent data might be hidden   Plan: 1. Continue lisinopril 20 mg and hydrochlorothiazide 25 mg 2. CMP today to assess kidney function and electrolytes

## 2020-09-09 ENCOUNTER — Other Ambulatory Visit: Payer: Self-pay | Admitting: Internal Medicine

## 2020-09-09 NOTE — Telephone Encounter (Signed)
Need refill on omeprazole (PRILOSEC) 40 MG capsule(Expired) ;pt contact [facility] [place] - [address]

## 2020-09-10 NOTE — Progress Notes (Signed)
Internal Medicine Clinic Attending  Case discussed with Dr. Coe  At the time of the visit.  We reviewed the resident's history and exam and pertinent patient test results.  I agree with the assessment, diagnosis, and plan of care documented in the resident's note.  

## 2020-09-19 ENCOUNTER — Encounter: Payer: Self-pay | Admitting: Internal Medicine

## 2020-09-19 ENCOUNTER — Ambulatory Visit (INDEPENDENT_AMBULATORY_CARE_PROVIDER_SITE_OTHER): Payer: Medicare Other | Admitting: Internal Medicine

## 2020-09-19 ENCOUNTER — Other Ambulatory Visit: Payer: Self-pay

## 2020-09-19 VITALS — BP 120/76 | HR 71 | Temp 98.2°F | Ht 68.0 in | Wt 194.5 lb

## 2020-09-19 DIAGNOSIS — G801 Spastic diplegic cerebral palsy: Secondary | ICD-10-CM | POA: Diagnosis not present

## 2020-09-19 DIAGNOSIS — I10 Essential (primary) hypertension: Secondary | ICD-10-CM | POA: Diagnosis not present

## 2020-09-19 DIAGNOSIS — R0781 Pleurodynia: Secondary | ICD-10-CM | POA: Diagnosis not present

## 2020-09-19 NOTE — Patient Instructions (Signed)
Thank you, Dustin Mora for allowing [place] to provide your care today. Today we discussed blood pressure, cholesterol, pain management.    I have ordered the following labs for you:  Lab Orders  No laboratory test(s) ordered today     Tests ordered today:  none  Referrals ordered today:   Referral Orders  No referral(s) requested today     I have ordered the following medication/changed the following medications:   Stop the following medications: There are no discontinued medications.   Start the following medications: No orders of the defined types were placed in this encounter.    Follow up: 6 months    Remember:   Should you have any questions or concerns please call the internal medicine clinic at [phone].     Marianna Payment, D.O. [facility]

## 2020-09-19 NOTE — Progress Notes (Signed)
CC: Cerebral palsy with spastic diplegia  HPI:  [name] is a 59 y.o. male with a past medical history stated below and presents today for seropositive spastic diplegia. Please see problem based assessment and plan for additional details.  Past Medical History:  Diagnosis Date  . Arthritis   . Cerebral palsy ([place])   . Hammer toe    right  . Hearing loss of both ears    Pt has bilateral hearing aids  . Hypertension   . Swollen L knee     Current Outpatient Medications on File Prior to Visit  Medication Sig Dispense Refill  . acetaminophen (TYLENOL) 500 MG tablet Take 2 tablets (1,000 mg total) by mouth in the morning, at noon, and at bedtime. 180 tablet 2  . Cholecalciferol (VITAMIN D3 PO) Take by mouth daily.    . fluticasone (FLONASE) 50 MCG/ACT nasal spray Place 2 sprays into both nostrils daily. 16 g 0  . hydrochlorothiazide (HYDRODIURIL) 25 MG tablet Take 1 tablet (25 mg total) by mouth daily. 30 tablet 3  . lisinopril (ZESTRIL) 20 MG tablet Take 1 tablet (20 mg total) by mouth daily. 30 tablet 3  . naproxen (NAPROSYN) 375 MG tablet Take 1 tablet (375 mg total) by mouth daily. 30 tablet 2  . omeprazole (PRILOSEC) 40 MG capsule Take 1 capsule by mouth once daily 30 capsule 0  . tiZANidine (ZANAFLEX) 4 MG tablet Take 4 mg by mouth every 8 (eight) hours as needed.     No current facility-administered medications on file prior to visit.    Family History  Problem Relation Age of Onset  . Prostate cancer Father     Social History   Socioeconomic History  . Marital status: Single    Spouse name: Not on file  . Number of children: Not on file  . Years of education: Not on file  . Highest education level: Not on file  Occupational History  . Not on file  Tobacco Use  . Smoking status: Current Every Day Smoker    Packs/day: 0.50    Years: 15.00    Pack years: 7.50    Types: Cigarettes  . Smokeless tobacco: Never Used  . Tobacco comment: Smoking 8 cigs  per day  Substance and Sexual Activity  . Alcohol use: Yes    Alcohol/week: 3.0 standard drinks    Types: 3 Standard drinks or equivalent per week  . Drug use: No  . Sexual activity: Not Currently  Other Topics Concern  . Not on file  Social History Narrative  . Not on file   Social Determinants of Health   Financial Resource Strain:   . Difficulty of Paying Living Expenses: Not on file  Food Insecurity:   . Worried About Charity fundraiser in the Last Year: Not on file  . Ran Out of Food in the Last Year: Not on file  Transportation Needs:   . Lack of Transportation (Medical): Not on file  . Lack of Transportation (Non-Medical): Not on file  Physical Activity:   . Days of Exercise per Week: Not on file  . Minutes of Exercise per Session: Not on file  Stress:   . Feeling of Stress : Not on file  Social Connections:   . Frequency of Communication with Friends and Family: Not on file  . Frequency of Social Gatherings with Friends and Family: Not on file  . Attends Religious Services: Not on file  . Active Member of Clubs  or Organizations: Not on file  . Attends Archivist Meetings: Not on file  . Marital Status: Not on file  Intimate Partner Violence:   . Fear of Current or Ex-Partner: Not on file  . Emotionally Abused: Not on file  . Physically Abused: Not on file  . Sexually Abused: Not on file    Review of Systems: ROS negative except for what is noted on the assessment and plan.  Vitals:   09/19/20 1418  BP: 120/76  Pulse: 71  Temp: 98.2 F (36.8 C)  TempSrc: Oral  SpO2: 99%  Weight: 194 lb 8 oz (88.2 kg)  Height: 5\' 8"  (1.727 m)     Physical Exam: Physical Exam Constitutional:      Appearance: Normal appearance.  HENT:     Head: Normocephalic and atraumatic.  Eyes:     Extraocular Movements: Extraocular movements intact.  Cardiovascular:     Rate and Rhythm: Normal rate.     Pulses: Normal pulses.     Heart sounds: Normal heart sounds.   Pulmonary:     Effort: Pulmonary effort is normal.     Breath sounds: Normal breath sounds.  Musculoskeletal:        General: Normal range of motion.     Cervical back: Normal range of motion.     Right lower leg: No edema.     Left lower leg: No edema.  Skin:    General: Skin is warm and dry.  Neurological:     Mental Status: He is alert and oriented to person, place, and time. Mental status is at baseline.     Comments: Lower extremity spastic diplegia with intact sensation and strength  Psychiatric:        Mood and Affect: Mood normal.      Assessment & Plan:   See Encounters Tab for problem based charting.  Patient discussed with Dr. Lars Mage, D.O. [facility], PGY-2 Pager: [phone], Phone: [phone] Date 09/20/2020 Time 7:31 AM

## 2020-09-20 ENCOUNTER — Encounter: Payer: Self-pay | Admitting: Internal Medicine

## 2020-09-20 NOTE — Assessment & Plan Note (Signed)
Patient states that he is tolerating his new regimen for pain control.  States that he has not noticed a decrease in his NSAID.  He states that he is not having to take too much Tylenol to control his pain but still feels as though his legs are uncomfortable secondary to muscle spasms.  I counseled him to continue Tylenol, tizanidine, and naproxen at this time.  I informed him that we will try to discontinue his naproxen at his next visit and start gabapentin or duloxetine.  Patient admits understanding.

## 2020-09-20 NOTE — Assessment & Plan Note (Signed)
Patient plans for reevaluation of hypertension.  Patient's blood pressure today is 120/76 on lisinopril 20 mg and hydrochlorothiazide 25 mg daily.  Patient states he is tolerating these medications well without side effects.  No need to check kidney function electrolytes at this visit as they were just done several weeks ago.  Plan: -Continue antihypertensive medications.

## 2020-09-23 NOTE — Progress Notes (Signed)
Internal Medicine Clinic Attending  Case discussed with Dr. Coe  At the time of the visit.  We reviewed the resident's history and exam and pertinent patient test results.  I agree with the assessment, diagnosis, and plan of care documented in the resident's note.  

## 2020-10-07 ENCOUNTER — Other Ambulatory Visit: Payer: Self-pay | Admitting: Internal Medicine

## 2020-10-07 ENCOUNTER — Other Ambulatory Visit: Payer: Self-pay | Admitting: Student

## 2020-10-07 NOTE — Telephone Encounter (Signed)
Requesting to speak with a nurse about meds. Please call pt back.  

## 2020-11-06 ENCOUNTER — Other Ambulatory Visit: Payer: Self-pay | Admitting: *Deleted

## 2020-11-06 MED ORDER — OMEPRAZOLE 40 MG PO CPDR
40.0000 mg | DELAYED_RELEASE_CAPSULE | Freq: Every day | ORAL | 2 refills | Status: DC
Start: 2020-11-06 — End: 2021-05-27

## 2020-11-06 NOTE — Telephone Encounter (Signed)
Will place medication refill.   Ideally he would decrease to the 375 mg naproxen but if he needs to take the 500 mg of naproxen once a day that is fine for now. He should limit the use as much as possible. Dr. Marianna Payment previously discussed the risks and benefits of NSAID use with him. If he would like to do a telehealth visit, we can discuss different medications that also might help his pain and allow [place] to decrease his naproxen.

## 2020-11-06 NOTE — Telephone Encounter (Signed)
Pt called /reqesting a 90 refill on Omeperazole. Also stated naproxen was decreased to 325 mg and put on E.S. Tylenol. Stated the 325 mg does not work as well as the 500 mg. And he started taking tylenol arthritis yesterday. He wants to know if it's ok to take the tylenol arthritis with 325 naproxen and/or go back to the 500 mg of naproxen (still has some at home)?

## 2020-11-07 NOTE — Telephone Encounter (Signed)
Called pt - informed of omeprazole refill. Also informed " if he needs to take the 500 mg of naproxen once a day that is fine for now. He should limit the use as much as possible." per Dr Sharon Seller. He stated he did not want to schedule a telehealth appt at this time.

## 2020-11-08 ENCOUNTER — Telehealth: Payer: Self-pay | Admitting: *Deleted

## 2020-11-11 IMAGING — MR MR PROSTATE WO/W CM
56 series · 56 of 56 positions shown · IV contrast (multihance)
Comparison: None.

CLINICAL DATA: Elevated PSA. Atypical small acinar proliferation of
prostate on biopsy.

Creatinine was obtained on site at [HOSPITAL] at [HOSPITAL].
Results: Creatinine 1.1 mg/dL.
EXAM:
MR PROSTATE WITHOUT AND WITH CONTRAST
TECHNIQUE: Multiplanar multisequence MRI images were obtained of the pelvis
centered about the prostate. Pre and post contrast images were
obtained.
CONTRAST:  17mL MULTIHANCE GADOBENATE DIMEGLUMINE 529 MG/ML IV SOLN

[Series 2: new loc · axial · 6.0mm · 0.88mm/px · 1 of 17 slices shown]
[im 1/17]
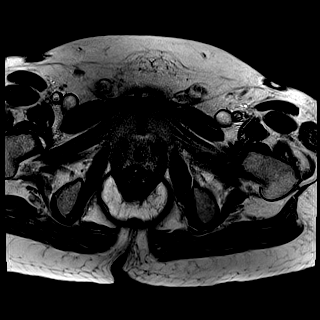

[Series 3: T1 · axial · 8.0mm · 1.06mm/px · 1 of 28 slices shown (1 of 2)]
[im 1/28]
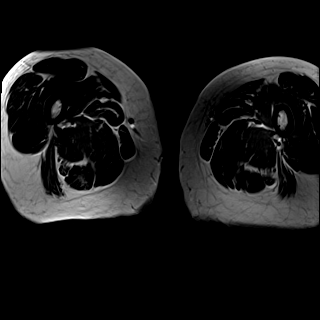

[Series 4: bSSFP fat-sat · axial · 8.0mm · 0.74mm/px · 1 of 28 slices shown]
[im 1/28]
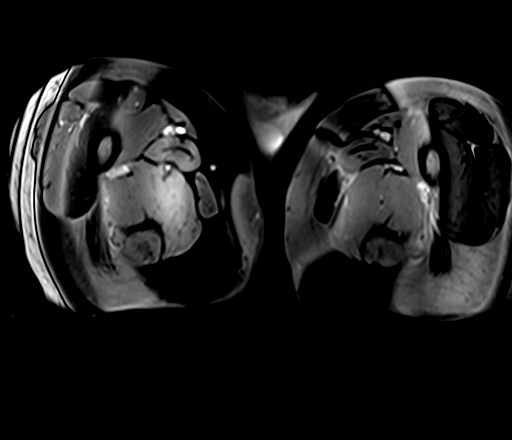

[Series 5: T2 · sagittal · 3.5mm · 0.56mm/px · 1 of 39 slices shown (1 of 4)]
[im 1/39]
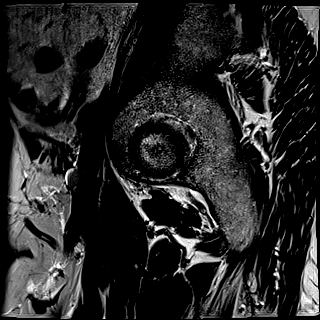

[Series 6: T1 · axial · 3.0mm · 0.31mm/px · 1 of 24 slices shown (2 of 2)]
[im 1/24]
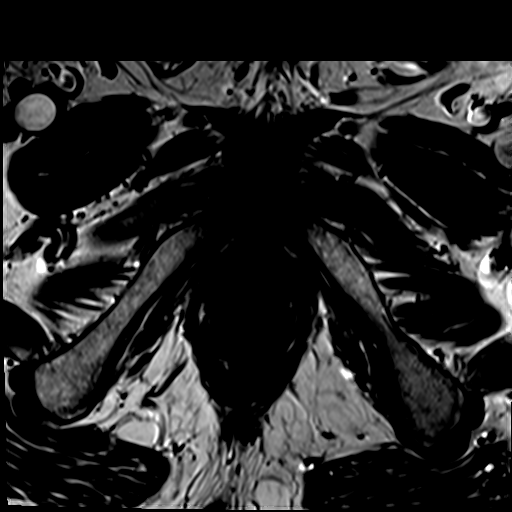

[Series 7: T2 · axial · 3.5mm · 0.56mm/px · 1 of 23 slices shown (2 of 4)]
[im 1/23]
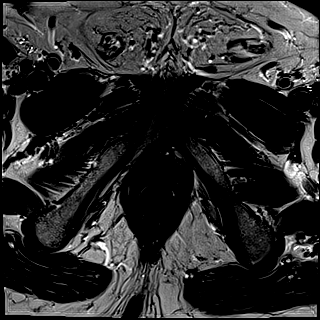

[Series 8: T2 · axial · 1.0mm · 1.04mm/px · 1 of 80 slices shown (3 of 4)]
[im 1/80]
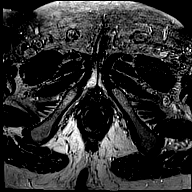

[Series 9: T2 · coronal · 3.5mm · 0.56mm/px · 1 of 23 slices shown (4 of 4)]
[im 1/23]
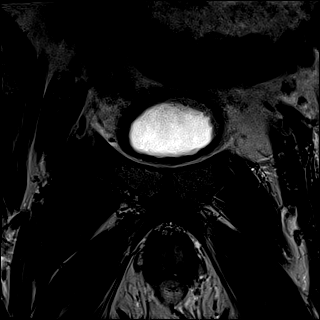

[Series 10: DWI · axial · 3.5mm · 1.56mm/px · 1 of 58 slices shown (1 of 2)]
[im 1/58]
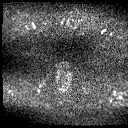

[Series 11: DWI · axial · 3.5mm · 1.56mm/px · 1 of 20 slices shown (2 of 2)]
[im 1/20]
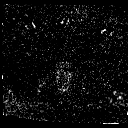

[Series 12: pre t1_twist_tra_dyn_ttc=5.3s · axial · non-contrast · 3.5mm · 0.83mm/px · 1 of 20 slices shown]
[im 1/20]
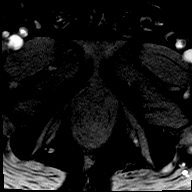

[Series 13: post t1_twist_tra_dyn-copy center · axial · 3.5mm · 0.83mm/px · 1 of 20 slices shown (1 of 23)]
[im 1/20]
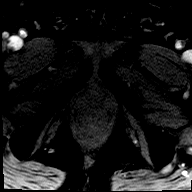

[Series 14: post t1_twist_tra_dyn-copy center · axial · 3.5mm · 0.83mm/px · 1 of 20 slices shown (2 of 23)]
[im 1/20]
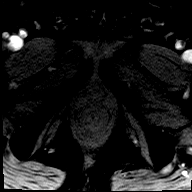

[Series 15: post t1_twist_tra_dyn-copy cent_sub_ttc=(id) · axial · 3.5mm · 0.83mm/px · 1 of 20 slices shown (1 of 22)]
[im 1/20]
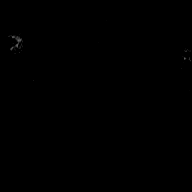

[Series 16: post t1_twist_tra_dyn-copy center · axial · 3.5mm · 0.83mm/px · 1 of 20 slices shown (3 of 23)]
[im 1/20]
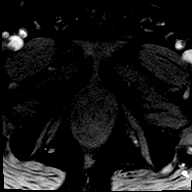

[Series 17: post t1_twist_tra_dyn-copy cent_sub_ttc=(id) · axial · 3.5mm · 0.83mm/px · 1 of 20 slices shown (2 of 22)]
[im 1/20]
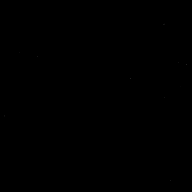

[Series 18: post t1_twist_tra_dyn-copy center · axial · 3.5mm · 0.83mm/px · 1 of 20 slices shown (4 of 23)]
[im 1/20]
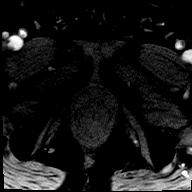

[Series 19: post t1_twist_tra_dyn-copy cent_sub_ttc=(id) · axial · 3.5mm · 0.83mm/px · 1 of 20 slices shown (3 of 22)]
[im 1/20]
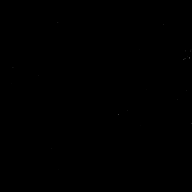

[Series 20: post t1_twist_tra_dyn-copy center · axial · 3.5mm · 0.83mm/px · 1 of 20 slices shown (5 of 23)]
[im 1/20]
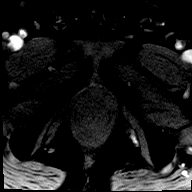

[Series 21: post t1_twist_tra_dyn-copy cent_sub_ttc=(id) · axial · 3.5mm · 0.83mm/px · 1 of 20 slices shown (4 of 22)]
[im 1/20]
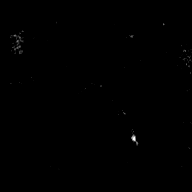

[Series 22: post t1_twist_tra_dyn-copy center · axial · 3.5mm · 0.83mm/px · 1 of 20 slices shown (6 of 23)]
[im 1/20]
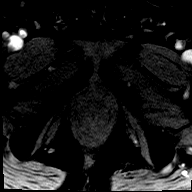

[Series 23: post t1_twist_tra_dyn-copy cent_sub_ttc=(id) · axial · 3.5mm · 0.83mm/px · 1 of 20 slices shown (5 of 22)]
[im 1/20]
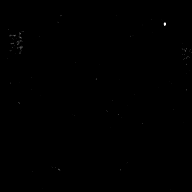

[Series 24: post t1_twist_tra_dyn-copy center · axial · 3.5mm · 0.83mm/px · 1 of 20 slices shown (7 of 23)]
[im 1/20]
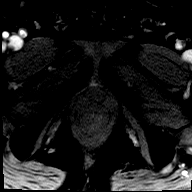

[Series 25: post t1_twist_tra_dyn-copy cent_sub_ttc=(id) · axial · 3.5mm · 0.83mm/px · 1 of 20 slices shown (6 of 22)]
[im 1/20]
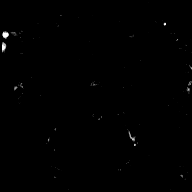

[Series 26: post t1_twist_tra_dyn-copy center · axial · 3.5mm · 0.83mm/px · 1 of 20 slices shown (8 of 23)]
[im 1/20]
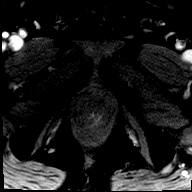

[Series 27: post t1_twist_tra_dyn-copy cent_sub_ttc=(id) · axial · 3.5mm · 0.83mm/px · 1 of 20 slices shown (7 of 22)]
[im 1/20]
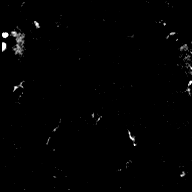

[Series 28: post t1_twist_tra_dyn-copy center · axial · 3.5mm · 0.83mm/px · 1 of 20 slices shown (9 of 23)]
[im 1/20]
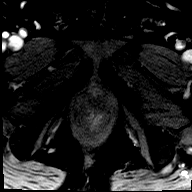

[Series 29: post t1_twist_tra_dyn-copy cent_sub_ttc=(id) · axial · 3.5mm · 0.83mm/px · 1 of 20 slices shown (8 of 22)]
[im 1/20]
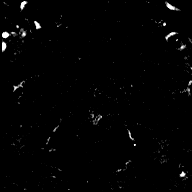

[Series 30: post t1_twist_tra_dyn-copy center · axial · 3.5mm · 0.83mm/px · 1 of 20 slices shown (10 of 23)]
[im 1/20]
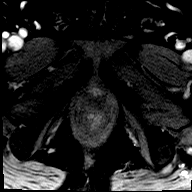

[Series 31: post t1_twist_tra_dyn-copy cent_sub_ttc=(id) · axial · 3.5mm · 0.83mm/px · 1 of 20 slices shown (9 of 22)]
[im 1/20]
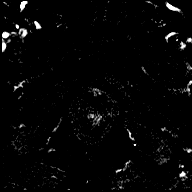

[Series 32: post t1_twist_tra_dyn-copy center · axial · 3.5mm · 0.83mm/px · 1 of 20 slices shown (11 of 23)]
[im 1/20]
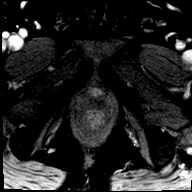

[Series 33: post t1_twist_tra_dyn-copy cent_sub_ttc=(id) · axial · 3.5mm · 0.83mm/px · 1 of 20 slices shown (10 of 22)]
[im 1/20]
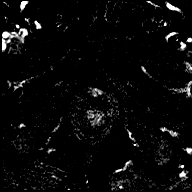

[Series 34: post t1_twist_tra_dyn-copy center · axial · 3.5mm · 0.83mm/px · 1 of 20 slices shown (12 of 23)]
[im 1/20]
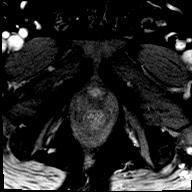

[Series 35: post t1_twist_tra_dyn-copy cent_sub_ttc=(id) · axial · 3.5mm · 0.83mm/px · 1 of 20 slices shown (11 of 22)]
[im 1/20]
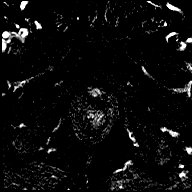

[Series 36: post t1_twist_tra_dyn-copy center · axial · 3.5mm · 0.83mm/px · 1 of 20 slices shown (13 of 23)]
[im 1/20]
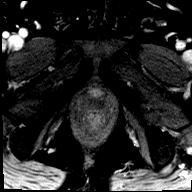

[Series 37: post t1_twist_tra_dyn-copy cent_sub_ttc=(id) · axial · 3.5mm · 0.83mm/px · 1 of 20 slices shown (12 of 22)]
[im 1/20]
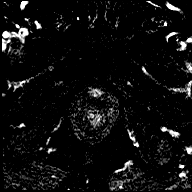

[Series 38: post t1_twist_tra_dyn-copy center · axial · 3.5mm · 0.83mm/px · 1 of 20 slices shown (14 of 23)]
[im 1/20]
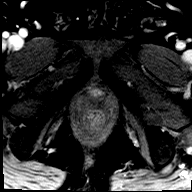

[Series 39: post t1_twist_tra_dyn-copy cent_sub_ttc=(id) · axial · 3.5mm · 0.83mm/px · 1 of 20 slices shown (13 of 22)]
[im 1/20]
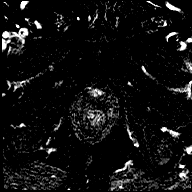

[Series 40: post t1_twist_tra_dyn-copy center · axial · 3.5mm · 0.83mm/px · 1 of 20 slices shown (15 of 23)]
[im 1/20]
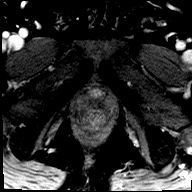

[Series 41: post t1_twist_tra_dyn-copy cent_sub_ttc=(id) · axial · 3.5mm · 0.83mm/px · 1 of 20 slices shown (14 of 22)]
[im 1/20]
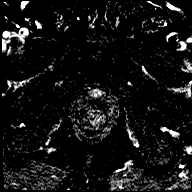

[Series 42: post t1_twist_tra_dyn-copy center · axial · 3.5mm · 0.83mm/px · 1 of 20 slices shown (16 of 23)]
[im 1/20]
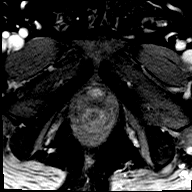

[Series 43: post t1_twist_tra_dyn-copy cent_sub_ttc=(id) · axial · 3.5mm · 0.83mm/px · 1 of 20 slices shown (15 of 22)]
[im 1/20]
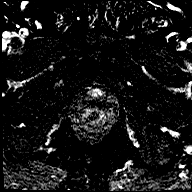

[Series 44: post t1_twist_tra_dyn-copy center · axial · 3.5mm · 0.83mm/px · 1 of 20 slices shown (17 of 23)]
[im 1/20]
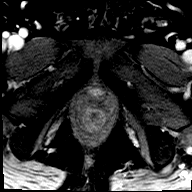

[Series 45: post t1_twist_tra_dyn-copy cent_sub_ttc=(id) · axial · 3.5mm · 0.83mm/px · 1 of 20 slices shown (16 of 22)]
[im 1/20]
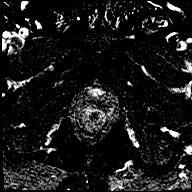

[Series 46: post t1_twist_tra_dyn-copy center · axial · 3.5mm · 0.83mm/px · 1 of 20 slices shown (18 of 23)]
[im 1/20]
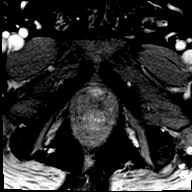

[Series 47: post t1_twist_tra_dyn-copy cent_sub_ttc=(id) · axial · 3.5mm · 0.83mm/px · 1 of 20 slices shown (17 of 22)]
[im 1/20]
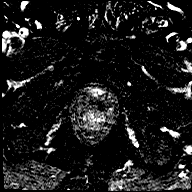

[Series 48: post t1_twist_tra_dyn-copy center · axial · 3.5mm · 0.83mm/px · 1 of 20 slices shown (19 of 23)]
[im 1/20]
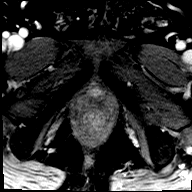

[Series 49: post t1_twist_tra_dyn-copy cent_sub_ttc=(id) · axial · 3.5mm · 0.83mm/px · 1 of 20 slices shown (18 of 22)]
[im 1/20]
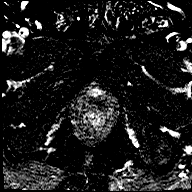

[Series 50: post t1_twist_tra_dyn-copy center · axial · 3.5mm · 0.83mm/px · 1 of 20 slices shown (20 of 23)]
[im 1/20]
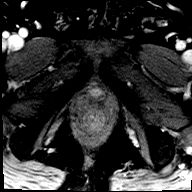

[Series 51: post t1_twist_tra_dyn-copy cent_sub_ttc=(id) · axial · 3.5mm · 0.83mm/px · 1 of 20 slices shown (19 of 22)]
[im 1/20]
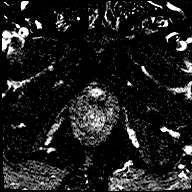

[Series 52: post t1_twist_tra_dyn-copy center · axial · 3.5mm · 0.83mm/px · 1 of 20 slices shown (21 of 23)]
[im 1/20]
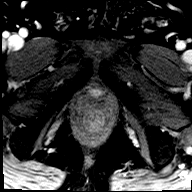

[Series 53: post t1_twist_tra_dyn-copy cent_sub_ttc=(id) · axial · 3.5mm · 0.83mm/px · 1 of 20 slices shown (20 of 22)]
[im 1/20]
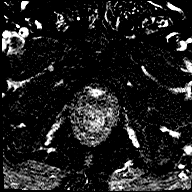

[Series 54: post t1_twist_tra_dyn-copy center · axial · 3.5mm · 0.83mm/px · 1 of 20 slices shown (22 of 23)]
[im 1/20]
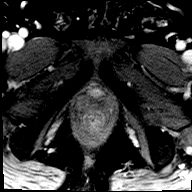

[Series 55: post t1_twist_tra_dyn-copy cent_sub_ttc=(id) · axial · 3.5mm · 0.83mm/px · 1 of 20 slices shown (21 of 22)]
[im 1/20]
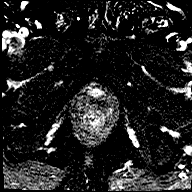

[Series 56: post t1_twist_tra_dyn-copy center · axial · 3.5mm · 0.83mm/px · 1 of 20 slices shown (23 of 23)]
[im 1/20]
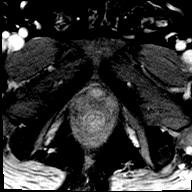

[Series 57: post t1_twist_tra_dyn-copy cent_sub_ttc=(id) · axial · 3.5mm · 0.83mm/px · 1 of 20 slices shown (22 of 22)]
[im 1/20]
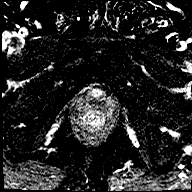

[56 of 56 positions shown; findings below may reference images not displayed]

FINDINGS: Prostate:

-- Peripheral Zone: No abnormality seen on ADC and high b-value DWI
sequences.

-- Transition/Central Zone: Small circumscribed BPH nodules noted,
but no suspicious nodules with obscured or non-circumscribed margins
seen.

-- Measurements/Volume:  5.1 x 4.0 x 6.0 cm (volume = 64 cm^3)

Transcapsular spread:  Absent

Seminal vesicle involvement:  Absent

Neurovascular bundle involvement:  Absent

Pelvic adenopathy: None visualized

Bone metastasis: None visualized

Other:  Sigmoid diverticulosis, without evidence of diverticulitis.
IMPRESSION: No radiographic evidence of high-grade prostate carcinoma. PI-RADS
1: Very Low (clinically significant cancer is highly unlikely to be
present)

## 2020-11-25 ENCOUNTER — Other Ambulatory Visit: Payer: Self-pay

## 2020-11-25 DIAGNOSIS — G802 Spastic hemiplegic cerebral palsy: Secondary | ICD-10-CM

## 2020-11-25 NOTE — Telephone Encounter (Signed)
  tiZANidine (ZANAFLEX) 4 MG tablet, refill request @  [id number] - [place] ([place]), [place] - [address] Phone:  [phone]  Fax:  [phone]

## 2020-11-26 MED ORDER — TIZANIDINE HCL 4 MG PO TABS: 4.0000 mg | ORAL_TABLET | Freq: Three times a day (TID) | ORAL | 2 refills | Status: DC | PRN

## 2021-01-22 ENCOUNTER — Other Ambulatory Visit: Payer: Self-pay | Admitting: Internal Medicine

## 2021-01-22 DIAGNOSIS — G802 Spastic hemiplegic cerebral palsy: Secondary | ICD-10-CM

## 2021-01-22 NOTE — Telephone Encounter (Signed)
Requesting naproxen to be filled @  [facility] ([place]), [place] - [address] Phone:  [phone]  Fax:  [phone]     Pt would like a call back.

## 2021-01-31 ENCOUNTER — Telehealth: Payer: Self-pay

## 2021-01-31 DIAGNOSIS — G801 Spastic diplegic cerebral palsy: Secondary | ICD-10-CM

## 2021-01-31 NOTE — Telephone Encounter (Signed)
Need refill on naproxen ;pt contact [place] ([place]), [place] - [address]

## 2021-01-31 NOTE — Telephone Encounter (Signed)
Return pt's call - states Dr Marianna Payment had reduced Naproxen to 375 mg from 500 mg. Med is not on current med list. Pt states he prefers the 500 mg but Dr Marianna Payment wants him to try the 375 mg. Pt needs a refill - sent to [place] [facility]. Thanks

## 2021-02-03 MED ORDER — NAPROXEN 375 MG PO TABS: 375.0000 mg | ORAL_TABLET | Freq: Every day | ORAL | 2 refills | Status: DC | PRN

## 2021-02-03 NOTE — Telephone Encounter (Signed)
Reviewed Dr. Sammie Bench notes, refill sent.

## 2021-02-27 ENCOUNTER — Encounter: Payer: Self-pay | Admitting: *Deleted

## 2021-02-27 DIAGNOSIS — I739 Peripheral vascular disease, unspecified: Secondary | ICD-10-CM | POA: Diagnosis not present

## 2021-02-27 DIAGNOSIS — L84 Corns and callosities: Secondary | ICD-10-CM | POA: Diagnosis not present

## 2021-02-27 DIAGNOSIS — L603 Nail dystrophy: Secondary | ICD-10-CM | POA: Diagnosis not present

## 2021-02-27 NOTE — Progress Notes (Signed)

## 2021-03-03 ENCOUNTER — Other Ambulatory Visit: Payer: Self-pay

## 2021-03-03 DIAGNOSIS — G802 Spastic hemiplegic cerebral palsy: Secondary | ICD-10-CM

## 2021-03-03 MED ORDER — TIZANIDINE HCL 4 MG PO TABS: 4.0000 mg | ORAL_TABLET | Freq: Three times a day (TID) | ORAL | 2 refills | Status: DC | PRN

## 2021-03-03 NOTE — Progress Notes (Signed)
Things That May Be Affecting Your Health:  Alcohol  Hearing loss  Pain    Depression  Home Safety  Sexual Health   Diabetes  Lack of physical activity  Stress  X Difficulty with daily activities  Loneliness  Tiredness   Drug use X Medicines X Tobacco use   Falls  Motor Vehicle Safety  Weight  X Food choices  Oral Health  Other    YOUR PERSONALIZED HEALTH PLAN : 1. Schedule your next subsequent Medicare Wellness visit in one year 2. Attend all of your regular appointments to address your medical issues 3. Complete the preventative screenings and services   Annual Wellness Visit   Medicare Covered Preventative Screenings and [place] Men and Women Who How Often Need? Date of Last Service Action  Abdominal Aortic Aneurysm Adults with AAA risk factors Once      Alcohol Misuse and Counseling All Adults Screening once a year if no alcohol misuse. Counseling up to 4 face to face sessions.     Bone Density Measurement  Adults at risk for osteoporosis Once every 2 yrs      Lipid Panel Z13.6 All adults without CV disease Once every 5 yrs       Colorectal Cancer   Stool sample or  Colonoscopy All adults 26 and older   Once every year  Every 10 years        Depression All Adults Once a year  Today   Diabetes Screening Blood glucose, post glucose load, or GTT Z13.1  All adults at risk  Pre-diabetics  Once per year  Twice per year      Diabetes  Self-Management Training All adults Diabetics 10 hrs first year; 2 hours subsequent years. Requires Copay     Glaucoma  Diabetics  Family history of glaucoma  African Americans 19 yrs +  Hispanic Americans 88 yrs + Annually - requires coppay X     Hepatitis C Z72.89 or F19.20  High Risk for HCV  Born between 1945 and 1965  Annually  Once      HIV Z11.4 All adults based on risk  Annually btw ages 12 & 46 regardless of risk  Annually > 65 yrs if at increased risk      Lung Cancer Screening  Asymptomatic adults aged 13-77 with 30 pack yr history and current smoker OR quit within the last 15 yrs Annually Must have counseling and shared decision making documentation before first screen      Medical Nutrition Therapy Adults with   Diabetes  Renal disease  Kidney transplant within past 3 yrs 3 hours first year; 2 hours subsequent years     Obesity and Counseling All adults Screening once a year Counseling if BMI 30 or higher  Today   Tobacco Use Counseling Adults who use tobacco  Up to 8 visits in one year X    Vaccines Z23  Hepatitis B  Influenza   Pneumonia  Adults   Once  Once every flu season  Two different vaccines separated by one year     Next Annual Wellness Visit People with Medicare Every year  Today     Services & Screenings Women Who How Often Need  Date of Last Service Action  Mammogram  Z12.31 Women over 67 One baseline ages 72-39. Annually ager 40 yrs+      Pap tests All women Annually if high risk. Every 2 yrs for normal risk women  Screening for cervical cancer with   Pap (Z01.419 nl or Z01.411abnl) &  HPV Z11.51 Women aged 66 to 79 Once every 5 yrs     Screening pelvic and breast exams All women Annually if high risk. Every 2 yrs for normal risk women     Sexually Transmitted Diseases  Chlamydia  Gonorrhea  Syphilis All at risk adults Annually for non pregnant females at increased risk         [place] Men Who How Ofter Need  Date of Last Service Action  Prostate Cancer - DRE & PSA Men over 50 Annually.  DRE might require a copay.        Sexually Transmitted Diseases  Syphilis All at risk adults Annually for men at increased risk      Health Maintenance List Health Maintenance  Topic Date Due  . TETANUS/TDAP  Never done  . COVID-19 Vaccine (3 - Booster for Pfizer series) 10/10/2020  . COLONOSCOPY (Pts 45-31yrs Insurance coverage will need to be confirmed)  05/18/2021  . INFLUENZA VACCINE  06/30/2021   . Hepatitis C Screening  Completed  . HIV Screening  Completed  . HPV VACCINES  Aged Out

## 2021-03-03 NOTE — Telephone Encounter (Signed)
Need refill on tiZANidine (ZANAFLEX) 4 MG tablet ;pt contact [place] ([place]), [place] - [address]

## 2021-05-06 ENCOUNTER — Other Ambulatory Visit: Payer: Self-pay | Admitting: Internal Medicine

## 2021-05-06 DIAGNOSIS — G801 Spastic diplegic cerebral palsy: Secondary | ICD-10-CM

## 2021-05-27 ENCOUNTER — Ambulatory Visit (INDEPENDENT_AMBULATORY_CARE_PROVIDER_SITE_OTHER): Payer: Medicare Other | Admitting: Internal Medicine

## 2021-05-27 ENCOUNTER — Other Ambulatory Visit: Payer: Self-pay

## 2021-05-27 ENCOUNTER — Encounter: Payer: Self-pay | Admitting: Internal Medicine

## 2021-05-27 VITALS — BP 126/83 | HR 75 | Temp 98.1°F | Ht 68.0 in | Wt 193.4 lb

## 2021-05-27 DIAGNOSIS — I10 Essential (primary) hypertension: Secondary | ICD-10-CM | POA: Diagnosis not present

## 2021-05-27 DIAGNOSIS — R600 Localized edema: Secondary | ICD-10-CM

## 2021-05-27 DIAGNOSIS — G801 Spastic diplegic cerebral palsy: Secondary | ICD-10-CM

## 2021-05-27 DIAGNOSIS — G802 Spastic hemiplegic cerebral palsy: Secondary | ICD-10-CM | POA: Diagnosis not present

## 2021-05-27 MED ORDER — FLUTICASONE PROPIONATE 50 MCG/ACT NA SUSP: 2.0000 | Freq: Every day | NASAL | 0 refills | Status: AC

## 2021-05-27 MED ORDER — TIZANIDINE HCL 4 MG PO TABS: 4.0000 mg | ORAL_TABLET | Freq: Three times a day (TID) | ORAL | 2 refills | Status: DC | PRN

## 2021-05-27 MED ORDER — OMEPRAZOLE 40 MG PO CPDR: 40.0000 mg | DELAYED_RELEASE_CAPSULE | Freq: Every day | ORAL | 2 refills | Status: DC

## 2021-05-27 MED ORDER — HYDROCHLOROTHIAZIDE 25 MG PO TABS: 25.0000 mg | ORAL_TABLET | Freq: Every day | ORAL | 2 refills | Status: DC

## 2021-05-27 MED ORDER — LISINOPRIL 20 MG PO TABS: 20.0000 mg | ORAL_TABLET | Freq: Every day | ORAL | 2 refills | Status: DC

## 2021-05-27 MED ORDER — NAPROXEN 375 MG PO TABS
ORAL_TABLET | ORAL | 0 refills | Status: DC
Start: 2021-05-27 — End: 2021-07-15

## 2021-05-27 NOTE — Patient Instructions (Addendum)
To Mr. Gilman, It was a pleasure meeting you today! Today we discussed your medications, blood pressure, and lower leg swelling.   For your medications, I will refill them today. I will also have you follow back with [place] in 6 weeks to see Dr. Marianna Payment again to further discuss your Naproxen.   For your blood pressure, your pressure is well controlled on your lisinopril and hydrochlorothiazide. We will check your kidney function today.   For your leg swelling, We will fit you with compression hose today.   We will see you in 6 weeks! Maudie Mercury, MD

## 2021-05-28 DIAGNOSIS — R600 Localized edema: Secondary | ICD-10-CM | POA: Insufficient documentation

## 2021-05-28 LAB — BMP8+ANION GAP
Anion Gap: 14 mmol/L (ref 10.0–18.0)
BUN/Creatinine Ratio: 14 (ref 10–24)
BUN: 15 mg/dL (ref 8–27)
CO2: 23 mmol/L (ref 20–29)
Calcium: 9.7 mg/dL (ref 8.6–10.2)
Chloride: 101 mmol/L (ref 96–106)
Creatinine, Ser: 1.09 mg/dL (ref 0.76–1.27)
Glucose: 73 mg/dL (ref 65–99)
Potassium: 4.2 mmol/L (ref 3.5–5.2)
Sodium: 138 mmol/L (ref 134–144)
eGFR: 77 mL/min/{1.73_m2} (ref >59)

## 2021-05-28 NOTE — Assessment & Plan Note (Signed)
Vitals with BMI 05/27/2021 09/19/2020 09/05/2020  Height 5\' 8"  5\' 8"  -  Weight 193 lbs 6 oz 194 lbs 8 oz 191 lbs 2 oz  BMI 75.83 07.46 -  Systolic 002 984 730  Diastolic 83 76 74  Pulse 75 71 76  Patient's blood pressure uncontrolled on hydrochlorothiazide 25 mg daily and lisinopril 20 mg daily.  He is compliant with his medications. - Continue current regimen - BMP today

## 2021-05-28 NOTE — Progress Notes (Signed)
   CC: Medication Refill  HPI:  [name] is a 60 y.o. person, with a PMH noted below, who presents to the clinic for check up and medication refill. To see the management of their acute and chronic conditions, please see the A&P note under the Encounters tab.   Past Medical History:  Diagnosis Date   Arthritis    Cerebral palsy ([place])    Hammer toe    right   Hearing loss of both ears    Pt has bilateral hearing aids   Hypertension    Swollen L knee    Review of Systems:   Review of Systems  Constitutional:  Negative for fever and malaise/fatigue.  Respiratory:  Negative for shortness of breath and wheezing.   Cardiovascular:  Negative for chest pain and palpitations.  Gastrointestinal:  Negative for abdominal pain, constipation, diarrhea, nausea and vomiting.  Musculoskeletal:  Negative for falls, myalgias and neck pain.       Mild right knee pain  Skin:  Negative for itching and rash.  Neurological:  Negative for dizziness, tingling, weakness and headaches.    Physical Exam:  Vitals:   05/27/21 1601  BP: 126/83  Pulse: 75  Temp: 98.1 F (36.7 C)  TempSrc: Oral  SpO2: 99%  Weight: 193 lb 6.4 oz (87.7 kg)  Height: 5\' 8"  (1.727 m)   Physical Exam Constitutional:      General: He is not in acute distress.    Appearance: Normal appearance. He is not ill-appearing or toxic-appearing.  HENT:     Head: Normocephalic and atraumatic.  Cardiovascular:     Rate and Rhythm: Normal rate and regular rhythm.     Pulses: Normal pulses.     Heart sounds: Normal heart sounds. No murmur heard.   No friction rub. No gallop.  Pulmonary:     Effort: Pulmonary effort is normal.     Breath sounds: Normal breath sounds. No wheezing, rhonchi or rales.  Musculoskeletal:        General: No deformity or signs of injury.     Comments: Trace pitting edema to the ankles  Skin:    General: Skin is warm.     Findings: No rash.  Neurological:     Mental Status: He is alert and  oriented to person, place, and time.  Psychiatric:        Mood and Affect: Mood normal.        Behavior: Behavior normal.     Assessment & Plan:   See Encounters Tab for problem based charting.  Patient discussed with Dr. Philipp Ovens

## 2021-05-28 NOTE — Assessment & Plan Note (Signed)
Patient states that he was doing well on his current regiment of Zanaflex 4 mg every 8 hours as needed, naproxen 375 mg once daily, and Tylenol.  We discussed Dr. Sammie Bench recommendation to discontinue naproxen and start on either gabapentin or duloxetine at this time.  Patient states that he would like to discuss this further with his primary care provider.  We will continue on current regimen at this time. - Follow up in 6 weeks to discuss regimen with PCP.

## 2021-05-28 NOTE — Assessment & Plan Note (Signed)
Patient states that he has noticed trace amounts of edema.  He states that he does have compression stockings, but they are very hard to put on.  States that he has little swelling after a good nights rest, but as soon as he stands up he notices swelling.  He states that if he does not put on his shoes, may become very difficult to put on due to the swelling.  He states that the fluid is to the level of his ankles. On physical examination there is trace bilateral lower extremity edema to the ankle.  Patient will benefit from continuing to use compression stockings will be fitted for compression hose today. - Size for compression hose

## 2021-05-29 DIAGNOSIS — I739 Peripheral vascular disease, unspecified: Secondary | ICD-10-CM | POA: Diagnosis not present

## 2021-05-29 DIAGNOSIS — L84 Corns and callosities: Secondary | ICD-10-CM | POA: Diagnosis not present

## 2021-05-29 DIAGNOSIS — L603 Nail dystrophy: Secondary | ICD-10-CM | POA: Diagnosis not present

## 2021-05-29 NOTE — Progress Notes (Signed)
Internal Medicine Clinic Attending ? ?Case discussed with Dr. Winters  At the time of the visit.  We reviewed the resident?s history and exam and pertinent patient test results.  I agree with the assessment, diagnosis, and plan of care documented in the resident?s note.  ?

## 2021-06-03 ENCOUNTER — Encounter: Payer: Self-pay | Admitting: *Deleted

## 2021-06-08 ENCOUNTER — Other Ambulatory Visit: Payer: Self-pay | Admitting: Internal Medicine

## 2021-06-08 DIAGNOSIS — G801 Spastic diplegic cerebral palsy: Secondary | ICD-10-CM

## 2021-06-08 DIAGNOSIS — G802 Spastic hemiplegic cerebral palsy: Secondary | ICD-10-CM

## 2021-06-19 ENCOUNTER — Ambulatory Visit: Payer: Medicare Other | Admitting: Pharmacist

## 2021-07-13 ENCOUNTER — Other Ambulatory Visit: Payer: Self-pay | Admitting: Internal Medicine

## 2021-07-13 DIAGNOSIS — G801 Spastic diplegic cerebral palsy: Secondary | ICD-10-CM

## 2021-08-03 ENCOUNTER — Ambulatory Visit: Payer: Medicare Other | Admitting: Internal Medicine

## 2021-08-03 NOTE — Progress Notes (Deleted)
Subjective:   Dustin Mora is a 60 y.o. male who presents for an Initial Medicare Annual Wellness Visit. I connected with  Dustin Mora on 08/03/21 by a audio enabled telemedicine application and verified that I am speaking with the correct person using two identifiers.   I discussed the limitations of evaluation and management by telemedicine. The patient expressed understanding and agreed to proceed.  Location of patient:  Location of provider:   Person participating in visit:   Review of Systems    Defer to PCP       Objective:    There were no vitals filed for this visit. There is no height or weight on file to calculate BMI.  Advanced Directives 05/27/2021 09/19/2020 09/05/2020 02/01/2020 10/17/2019 01/28/2016  Does Patient Have a Medical Advance Directive? No No No No No No  Would patient like information on creating a medical advance directive? No - Patient declined No - Patient declined No - Patient declined No - Patient declined No - Patient declined No - patient declined information    Current Medications (verified) Outpatient Encounter Medications as of 08/03/2021  Medication Sig   Cholecalciferol (VITAMIN D3 PO) Take by mouth daily.   fluticasone (FLONASE) 50 MCG/ACT nasal spray Place 2 sprays into both nostrils daily.   hydrochlorothiazide (HYDRODIURIL) 25 MG tablet Take 1 tablet (25 mg total) by mouth daily.   lisinopril (ZESTRIL) 20 MG tablet Take 1 tablet (20 mg total) by mouth daily.   naproxen (NAPROSYN) 375 MG tablet TAKE 1 TABLET BY MOUTH ONCE DAILY AS NEEDED FOR MODERATE PAIN   omeprazole (PRILOSEC) 40 MG capsule Take 1 capsule (40 mg total) by mouth daily.   tiZANidine (ZANAFLEX) 4 MG tablet Take 1 tablet (4 mg total) by mouth every 8 (eight) hours as needed. 30 per month   No facility-administered encounter medications on file as of 08/03/2021.    Allergies (verified) Patient has no known allergies.   History: Past Medical History:  Diagnosis Date    Arthritis    Cerebral palsy ([place])    Hammer toe    right   Hearing loss of both ears    Pt has bilateral hearing aids   Hypertension    Swollen L knee    Past Surgical History:  Procedure Laterality Date   COLONOSCOPY  05/01/2011   diverticulosis.  [place].   FOOT SURGERY     due to CP   Family History  Problem Relation Age of Onset   Prostate cancer Father    Social History   Socioeconomic History   Marital status: Single    Spouse name: Not on file   Number of children: Not on file   Years of education: Not on file   Highest education level: Not on file  Occupational History   Not on file  Tobacco Use   Smoking status: Every Day    Packs/day: 0.50    Years: 15.00    Pack years: 7.50    Types: Cigarettes   Smokeless tobacco: Never   Tobacco comments:    Smoking 8 cigs per day  Substance and Sexual Activity   Alcohol use: Yes    Alcohol/week: 3.0 standard drinks    Types: 3 Standard drinks or equivalent per week   Drug use: No   Sexual activity: Not Currently  Other Topics Concern   Not on file  Social History Narrative   Not on file   Social Determinants of Health   Financial Resource  Strain: Not on file  Food Insecurity: Not on file  Transportation Needs: Not on file  Physical Activity: Not on file  Stress: Not on file  Social Connections: Not on file    Tobacco Counseling Ready to quit: Not Answered Counseling given: Not Answered Tobacco comments: Smoking 8 cigs per day   Clinical Intake:                 Diabetic?n/a         Activities of Daily Living In your present state of health, do you have any difficulty performing the following activities: 05/27/2021 09/19/2020  Hearing? N Y  Comment - hearing Aid  Vision? N Y  Comment - Eyes itching this am  Difficulty concentrating or making decisions? N N  Walking or climbing stairs? Y Y  Dressing or bathing? N N  Doing errands, shopping? Tempie Donning  Comment - Transportation  Some  recent data might be hidden    Patient Care Team: Marianna Payment, MD as PCP - General (Internal Medicine) Rana Snare, MD (Inactive) as Consulting Physician (Urology)  Indicate any recent Medical Services you may have received from other than [facility] providers in the past year (date may be approximate).     Assessment:   This is a routine wellness examination for Dustin Mora.  Hearing/Vision screen No results found.  Dietary issues and exercise activities discussed:     Goals Addressed   None    Depression Screen PHQ 2/9 Scores 05/27/2021 09/19/2020 09/05/2020 05/09/2020 02/01/2020 10/17/2019 11/28/2016  PHQ - 2 Score 0 0 0 0 1 0 0  PHQ- 9 Score - 0 3 1 - 1 -    Fall Risk Fall Risk  09/19/2020 09/05/2020 05/09/2020 02/01/2020 10/17/2019  Falls in the past year? 0 1 0 1 -  Number falls in past yr: - 0 - 0 1  Comment - - - - 2  Injury with Fall? - 1 - 1 1  Risk for fall due to : History of fall(s);Impaired balance/gait;Impaired mobility Impaired balance/gait;Impaired mobility;History of fall(s) Impaired balance/gait;Impaired mobility (No Data) History of fall(s);Impaired balance/gait;Impaired mobility  Risk for fall due to: Comment - - - Loss balance while pushing a wagon. -  Follow up Falls prevention discussed Falls evaluation completed;Falls prevention discussed Falls evaluation completed Falls prevention discussed Falls prevention discussed    FALL RISK PREVENTION PERTAINING TO THE HOME:  Any stairs in or around the home? {YES/NO:21197} If so, are there any without handrails? {YES/NO:21197} Home free of loose throw rugs in walkways, pet beds, electrical cords, etc? {YES/NO:21197} Adequate lighting in your home to reduce risk of falls? {YES/NO:21197}  ASSISTIVE DEVICES UTILIZED TO PREVENT FALLS:  Life alert? {YES/NO:21197} Use of a cane, walker or w/c? {YES/NO:21197} Grab bars in the bathroom? {YES/NO:21197} Shower chair or bench in shower? {YES/NO:21197} Elevated toilet seat or  a handicapped toilet? {YES/NO:21197}  TIMED UP AND GO:  Was the test performed? {YES/NO:21197}.  Length of time to ambulate 10 feet:  sec.   {Appearance of GA:4730917  Cognitive Function:        Immunizations Immunization History  Administered Date(s) Administered   Influenza,inj,Quad PF,6+ Mos 09/21/2018, 09/05/2020   Influenza-Unspecified 07/31/2013   PFIZER(Purple Top)SARS-COV-2 Vaccination 03/14/2020, 04/09/2020    {TDAP status:2101805}  {Flu Vaccine status:2101806}  {Pneumococcal vaccine status:2101807}  {Covid-19 vaccine status:2101808}  Qualifies for Shingles Vaccine? {YES/NO:21197}  Zostavax completed {YES/NO:21197}  {Shingrix Completed?:2101804}  Screening Tests Health Maintenance  Topic Date Due   Pneumococcal Vaccine 35-78 Years old (1 -  PCV) Never done   TETANUS/TDAP  Never done   Zoster Vaccines- Shingrix (1 of 2) Never done   COVID-19 Vaccine (3 - Booster for Pfizer series) 09/09/2020   COLONOSCOPY (Pts 45-77yr Insurance coverage will need to be confirmed)  05/18/2021   INFLUENZA VACCINE  06/30/2021   Hepatitis C Screening  Completed   HIV Screening  Completed   HPV VACCINES  Aged Out    Health Maintenance  Health Maintenance Due  Topic Date Due   Pneumococcal Vaccine 050611Years old (1 - PCV) Never done   TETANUS/TDAP  Never done   Zoster Vaccines- Shingrix (1 of 2) Never done   COVID-19 Vaccine (3 - Booster for Pfizer series) 09/09/2020   COLONOSCOPY (Pts 45-459yrInsurance coverage will need to be confirmed)  05/18/2021   INFLUENZA VACCINE  06/30/2021    {Colorectal cancer screening:2101809}  Lung Cancer Screening: (Low Dose CT Chest recommended if Age 61-80ears, 30 pack-year currently smoking OR have quit w/in 15years.) {DOES NOT does:27190::"does not"} qualify.   Lung Cancer Screening Referral: ***  Additional Screening:  Hepatitis C Screening: {DOES NOT does:27190::"does not"} qualify; Completed ***  Vision Screening:  Recommended annual ophthalmology exams for early detection of glaucoma and other disorders of the eye. Is the patient up to date with their annual eye exam?  {YES/NO:21197} Who is the provider or what is the name of the office in which the patient attends annual eye exams? *** If pt is not established with a provider, would they like to be referred to a provider to establish care? {YES/NO:21197}.   Dental Screening: Recommended annual dental exams for proper oral hygiene  Community Resource Referral / Chronic Care Management: CRR required this visit?  {YES/NO:21197}  CCM required this visit?  {YES/NO:21197}     Plan:     I have personally reviewed and noted the following in the patient's chart:   Medical and social history Use of alcohol, tobacco or illicit drugs  Current medications and supplements including opioid prescriptions. {Opioid Prescriptions:563 [id number]} Functional ability and status Nutritional status Physical activity Advanced directives List of other physicians Hospitalizations, surgeries, and ER visits in previous 12 months Vitals Screenings to include cognitive, depression, and falls Referrals and appointments  In addition, I have reviewed and discussed with patient certain preventive protocols, quality metrics, and best practice recommendations. A written personalized care plan for preventive services as well as general preventive health recommendations were provided to patient.     ChDema SeverinCMAbercrombie 08/03/2021   Nurse Notes: ***

## 2021-08-15 ENCOUNTER — Other Ambulatory Visit: Payer: Self-pay | Admitting: Internal Medicine

## 2021-08-15 DIAGNOSIS — G801 Spastic diplegic cerebral palsy: Secondary | ICD-10-CM

## 2021-08-27 ENCOUNTER — Encounter: Payer: Self-pay | Admitting: Gastroenterology

## 2021-08-28 DIAGNOSIS — L603 Nail dystrophy: Secondary | ICD-10-CM | POA: Diagnosis not present

## 2021-08-28 DIAGNOSIS — I739 Peripheral vascular disease, unspecified: Secondary | ICD-10-CM | POA: Diagnosis not present

## 2021-08-28 DIAGNOSIS — L84 Corns and callosities: Secondary | ICD-10-CM | POA: Diagnosis not present

## 2021-09-18 ENCOUNTER — Other Ambulatory Visit: Payer: Self-pay | Admitting: Internal Medicine

## 2021-09-18 DIAGNOSIS — G802 Spastic hemiplegic cerebral palsy: Secondary | ICD-10-CM

## 2021-09-18 DIAGNOSIS — G801 Spastic diplegic cerebral palsy: Secondary | ICD-10-CM

## 2021-10-22 ENCOUNTER — Other Ambulatory Visit: Payer: Self-pay | Admitting: Internal Medicine

## 2021-10-22 DIAGNOSIS — G801 Spastic diplegic cerebral palsy: Secondary | ICD-10-CM

## 2021-10-30 ENCOUNTER — Other Ambulatory Visit: Payer: Self-pay | Admitting: Internal Medicine

## 2021-10-30 DIAGNOSIS — G802 Spastic hemiplegic cerebral palsy: Secondary | ICD-10-CM

## 2021-11-12 ENCOUNTER — Encounter: Payer: Self-pay | Admitting: Internal Medicine

## 2021-11-12 ENCOUNTER — Other Ambulatory Visit: Payer: Self-pay

## 2021-11-12 ENCOUNTER — Ambulatory Visit (INDEPENDENT_AMBULATORY_CARE_PROVIDER_SITE_OTHER): Payer: Medicare Other | Admitting: Internal Medicine

## 2021-11-12 VITALS — BP 104/67 | HR 82 | Temp 98.5°F | Ht 68.0 in | Wt 189.2 lb

## 2021-11-12 DIAGNOSIS — Z1211 Encounter for screening for malignant neoplasm of colon: Secondary | ICD-10-CM | POA: Diagnosis not present

## 2021-11-12 DIAGNOSIS — G802 Spastic hemiplegic cerebral palsy: Secondary | ICD-10-CM

## 2021-11-12 DIAGNOSIS — I10 Essential (primary) hypertension: Secondary | ICD-10-CM

## 2021-11-12 DIAGNOSIS — G801 Spastic diplegic cerebral palsy: Secondary | ICD-10-CM

## 2021-11-12 DIAGNOSIS — E782 Mixed hyperlipidemia: Secondary | ICD-10-CM | POA: Diagnosis not present

## 2021-11-12 DIAGNOSIS — Z0000 Encounter for general adult medical examination without abnormal findings: Secondary | ICD-10-CM

## 2021-11-12 MED ORDER — TIZANIDINE HCL 4 MG PO TABS: 4.0000 mg | ORAL_TABLET | Freq: Three times a day (TID) | ORAL | 2 refills | Status: DC | PRN

## 2021-11-12 MED ORDER — NAPROXEN 375 MG PO TABS: 375.0000 mg | ORAL_TABLET | Freq: Every day | ORAL | 2 refills | Status: DC | PRN

## 2021-11-12 NOTE — Patient Instructions (Addendum)
Thank you, [name] for allowing [place] to provide your care today. Today we discussed .    Labs/Tests Ordered: Lab Orders         Fecal occult blood, imunochemical       Referrals Ordered:  Referral Orders  No referral(s) requested today     Medication Changes:    Meds ordered this encounter  Medications   tiZANidine (ZANAFLEX) 4 MG tablet    Sig: Take 1 tablet (4 mg total) by mouth every 8 (eight) hours as needed for up to 90 doses.    Dispense:  90 tablet    Refill:  2   naproxen (NAPROSYN) 375 MG tablet    Sig: Take 1 tablet (375 mg total) by mouth daily as needed.    Dispense:  30 tablet    Refill:  2      Health Maintenance Screening: There are no preventive care reminders to display for this patient.   Instructions:   Follow up: 4-6 months   Remember: If you have any questions or concerns, call our clinic at [phone] or after hours call [phone] and ask for the internal medicine resident on call.  Dustin Mora, D.O. [place]

## 2021-11-12 NOTE — Progress Notes (Signed)
Subjective:  CC: cerebral palsy  HPI:  Dustin Mora is a 60 y.o. male with a past medical history stated below and presents today for cerebral palsy . Please see problem based assessment and plan for additional details.  Past Medical History:  Diagnosis Date   Arthritis    Cerebral palsy ([place])    Hammer toe    right   Hearing loss of both ears    Pt has bilateral hearing aids   Hypertension    Swollen L knee     Current Outpatient Medications on File Prior to Visit  Medication Sig Dispense Refill   Cholecalciferol (VITAMIN D3 PO) Take by mouth daily.     fluticasone (FLONASE) 50 MCG/ACT nasal spray Place 2 sprays into both nostrils daily. 16 g 0   hydrochlorothiazide (HYDRODIURIL) 25 MG tablet Take 1 tablet (25 mg total) by mouth daily. 90 tablet 2   lisinopril (ZESTRIL) 20 MG tablet Take 1 tablet (20 mg total) by mouth daily. 90 tablet 2   omeprazole (PRILOSEC) 40 MG capsule Take 1 capsule (40 mg total) by mouth daily. 90 capsule 2   No current facility-administered medications on file prior to visit.    Family History  Problem Relation Age of Onset   Prostate cancer Father     Social History   Socioeconomic History   Marital status: Single    Spouse name: Not on file   Number of children: Not on file   Years of education: Not on file   Highest education level: Not on file  Occupational History   Not on file  Tobacco Use   Smoking status: Every Day    Packs/day: 0.50    Years: 15.00    Pack years: 7.50    Types: Cigarettes   Smokeless tobacco: Never   Tobacco comments:    Smoking 8 cigs per day  Substance and Sexual Activity   Alcohol use: Yes    Alcohol/week: 3.0 standard drinks    Types: 3 Standard drinks or equivalent per week   Drug use: No   Sexual activity: Not Currently  Other Topics Concern   Not on file  Social History Narrative   Not on file   Social Determinants of Health   Financial Resource Strain: Not on file  Food  Insecurity: Not on file  Transportation Needs: Not on file  Physical Activity: Not on file  Stress: Not on file  Social Connections: Not on file  Intimate Partner Violence: Not on file    Review of Systems: ROS negative except for what is noted on the assessment and plan.  Objective:   Vitals:   11/12/21 0948  BP: 104/67  Pulse: 82  Temp: 98.5 F (36.9 C)  TempSrc: Oral  SpO2: 97%  Weight: 189 lb 3.2 oz (85.8 kg)  Height: 5\' 8"  (1.727 m)    Physical Exam: Gen: A&O x3 and in no apparent distress, well appearing and nourished. Neck: no masses or nodules, AROM intact. CV: RRR, no murmurs, S1/S2 presents  Resp: Clear to ascultation bilaterally  Abd: BS (+) x4, soft, non-tender abdomen, without hepatosplenomegaly or masses MSK: Grossly normal AROM and strength x4 extremities. Skin: good skin turgor, no rashes, unusual bruising, or prominent lesions.  Neuro: No focal deficits, grossly normal sensation and coordination.     Assessment & Plan:  See Encounters Tab for problem based charting.  Patient discussed with Dr. Hilbert Odor, D.O. [facility]  PGY-3 Pager: [phone]   Phone: [phone] Date 11/15/2021   Time 8:31 PM

## 2021-11-15 ENCOUNTER — Encounter: Payer: Self-pay | Admitting: Internal Medicine

## 2021-11-15 NOTE — Assessment & Plan Note (Signed)
BP well controlled.  Recent blood pressures:  BP Readings from Last 3 Encounters:  11/12/21 104/67  05/27/21 126/83  09/19/20 120/76

## 2021-11-15 NOTE — Assessment & Plan Note (Signed)
Refer to GI for colonoscopy.

## 2021-11-15 NOTE — Assessment & Plan Note (Addendum)
Patient states that he is doing well on tizanidine and sometime does not feel like he needs to use the muscle relaxer. He denies any recent falls. He denies any med side effects.   Plan: - Cont tizanedine  - cont NSAIDs as needed

## 2021-11-19 ENCOUNTER — Encounter: Payer: Medicare Other | Admitting: Internal Medicine

## 2021-11-25 NOTE — Progress Notes (Signed)
Internal Medicine Clinic Attending  Case discussed with Dr. Coe  At the time of the visit.  We reviewed the resident's history and exam and pertinent patient test results.  I agree with the assessment, diagnosis, and plan of care documented in the resident's note.  

## 2021-11-26 ENCOUNTER — Encounter: Payer: Self-pay | Admitting: Gastroenterology

## 2021-11-27 DIAGNOSIS — I739 Peripheral vascular disease, unspecified: Secondary | ICD-10-CM | POA: Diagnosis not present

## 2021-11-27 DIAGNOSIS — L84 Corns and callosities: Secondary | ICD-10-CM | POA: Diagnosis not present

## 2021-11-27 DIAGNOSIS — L603 Nail dystrophy: Secondary | ICD-10-CM | POA: Diagnosis not present

## 2021-12-24 ENCOUNTER — Other Ambulatory Visit: Payer: Self-pay

## 2021-12-24 ENCOUNTER — Ambulatory Visit (AMBULATORY_SURGERY_CENTER): Payer: Medicare Other | Admitting: *Deleted

## 2021-12-24 VITALS — Ht 68.0 in | Wt 189.0 lb

## 2021-12-24 DIAGNOSIS — Z1211 Encounter for screening for malignant neoplasm of colon: Secondary | ICD-10-CM

## 2021-12-24 MED ORDER — NA SULFATE-K SULFATE-MG SULF 17.5-3.13-1.6 GM/177ML PO SOLN: 1.0000 | Freq: Once | ORAL | 0 refills | Status: AC

## 2021-12-24 NOTE — Progress Notes (Signed)
Virtual pre visit completed over telephone.  Instructions mailed.      No egg or soy allergy known to patient  No issues known to pt with past sedation with any surgeries or procedures Patient denies ever being told they had issues or difficulty with intubation  No FH of Malignant Hyperthermia Pt is not on diet pills Pt is not on  home 02  Pt is not on blood thinners  Pt denies issues with constipation  No A fib or A flutter  Due to the COVID-19 pandemic we are asking patients to follow certain guidelines in PV and the [place]   Pt aware of COVID protocols and [facility] guidelines   PV completed over the phone. Pt verified name, DOB, address and insurance during PV today.  Pt mailed instruction packet with copy of consent form to read and not return, and instructions.  Pt encouraged to call with questions or issues.  If pt has My chart, procedure instructions sent via My Chart

## 2022-01-07 ENCOUNTER — Other Ambulatory Visit: Payer: Self-pay

## 2022-01-07 ENCOUNTER — Encounter: Payer: Self-pay | Admitting: Gastroenterology

## 2022-01-07 ENCOUNTER — Ambulatory Visit (AMBULATORY_SURGERY_CENTER): Payer: Medicare Other | Admitting: Gastroenterology

## 2022-01-07 VITALS — BP 106/69 | HR 71 | Temp 98.0°F | Resp 24 | Ht 68.0 in | Wt 189.0 lb

## 2022-01-07 DIAGNOSIS — D122 Benign neoplasm of ascending colon: Secondary | ICD-10-CM | POA: Diagnosis not present

## 2022-01-07 DIAGNOSIS — Z1211 Encounter for screening for malignant neoplasm of colon: Secondary | ICD-10-CM

## 2022-01-07 MED ORDER — SODIUM CHLORIDE 0.9 % IV SOLN: 500.0000 mL | Freq: Once | INTRAVENOUS | Status: DC

## 2022-01-07 NOTE — Op Note (Signed)
[place] Patient Name: Dustin Mora Procedure Date: 01/07/2022 11:29 AM MRN: [id number] Endoscopist: Mallie Mussel L. Loletha Carrow , MD Age: 61 Referring MD:  Date of Birth: [birth year] Gender: Male Account #: [id number] Procedure:                Colonoscopy Indications:              Screening for colorectal malignant neoplasm                           no polyps last colonoscopy June 2012 Medicines:                Monitored Anesthesia Care Procedure:                Pre-Anesthesia Assessment:                           - Prior to the procedure, a History and Physical                            was performed, and patient medications and                            allergies were reviewed. The patient's tolerance of                            previous anesthesia was also reviewed. The risks                            and benefits of the procedure and the sedation                            options and risks were discussed with the patient.                            All questions were answered, and informed consent                            was obtained. Prior Anticoagulants: The patient has                            taken no previous anticoagulant or antiplatelet                            agents. ASA Grade Assessment: III - A patient with                            severe systemic disease. After reviewing the risks                            and benefits, the patient was deemed in                            satisfactory condition to undergo the procedure.  After obtaining informed consent, the colonoscope                            was passed under direct vision. Throughout the                            procedure, the patient's blood pressure, pulse, and                            oxygen saturations were monitored continuously. The                            PCF-HQ190L Colonoscope was introduced through the                            anus and advanced to the the  cecum, identified by                            appendiceal orifice and ileocecal valve. The                            colonoscopy was performed without difficulty. The                            patient tolerated the procedure well. The quality                            of the bowel preparation was good. The ileocecal                            valve, appendiceal orifice, and rectum were                            photographed. The bowel preparation used was SUPREP. Scope In: 11:42:28 AM Scope Out: 11:58:10 AM Scope Withdrawal Time: 0 hours 13 minutes 25 seconds  Total Procedure Duration: 0 hours 15 minutes 42 seconds  Findings:                 The perianal and digital rectal examinations were                            normal.                           Repeat examination of right colon under NBI                            performed.                           A diminutive polyp was found in the hepatic                            flexure. The polyp was sessile. The polyp was  removed with a cold snare. Resection and retrieval                            were complete.                           Multiple diverticula were found in the left colon.                           Internal hemorrhoids were found. The hemorrhoids                            were small.                           The exam was otherwise without abnormality on                            direct and retroflexion views. Complications:            No immediate complications. Estimated Blood Loss:     Estimated blood loss was minimal. Impression:               - One diminutive polyp at the hepatic flexure,                            removed with a cold snare. Resected and retrieved.                           - Diverticulosis in the left colon.                           - Internal hemorrhoids.                           - The examination was otherwise normal on direct                            and  retroflexion views. Recommendation:           - Patient has a contact number available for                            emergencies. The signs and symptoms of potential                            delayed complications were discussed with the                            patient. Return to normal activities tomorrow.                            Written discharge instructions were provided to the                            patient.                           -  Resume previous diet.                           - Continue present medications.                           - Await pathology results.                           - Repeat colonoscopy is recommended for                            surveillance. The colonoscopy date will be                            determined after pathology results from today's                            exam become available for review. Lititia Sen L. Loletha Carrow, MD 01/07/2022 12:02:36 PM This report has been signed electronically.

## 2022-01-07 NOTE — Patient Instructions (Addendum)
Handout on polyps, Hemorrhoids and diverticulosis provided.  Await pathology results.  Resume previous diet.  Continue present medications.  Repeat colonoscopy is recommended for surveillance. The colonoscopy date will be determined after pathology results from today's exam become available for review.  YOU HAD AN ENDOSCOPIC PROCEDURE TODAY AT [facility]:   Refer to the procedure report that was given to you for any specific questions about what was found during the examination.  If the procedure report does not answer your questions, please call your gastroenterologist to clarify.  If you requested that your care partner not be given the details of your procedure findings, then the procedure report has been included in a sealed envelope for you to review at your convenience later.  YOU SHOULD EXPECT: Some feelings of bloating in the abdomen. Passage of more gas than usual.  Walking can help get rid of the air that was put into your GI tract during the procedure and reduce the bloating. If you had a lower endoscopy (such as a colonoscopy or flexible sigmoidoscopy) you may notice spotting of blood in your stool or on the toilet paper. If you underwent a bowel prep for your procedure, you may not have a normal bowel movement for a few days.  Please Note:  You might notice some irritation and congestion in your nose or some drainage.  This is from the oxygen used during your procedure.  There is no need for concern and it should clear up in a day or so.  SYMPTOMS TO REPORT IMMEDIATELY:  Following lower endoscopy (colonoscopy or flexible sigmoidoscopy):  Excessive amounts of blood in the stool  Significant tenderness or worsening of abdominal pains  Swelling of the abdomen that is new, acute  Fever of 100F or higher   For urgent or emergent issues, a gastroenterologist can be reached at any hour by calling [phone]. Do not use MyChart messaging for urgent concerns.     DIET:  We do recommend a small meal at first, but then you may proceed to your regular diet.  Drink plenty of fluids but you should avoid alcoholic beverages for 24 hours.  ACTIVITY:  You should plan to take it easy for the rest of today and you should NOT DRIVE or use heavy machinery until tomorrow (because of the sedation medicines used during the test).    FOLLOW UP: Our staff will call the number listed on your records 48-72 hours following your procedure to check on you and address any questions or concerns that you may have regarding the information given to you following your procedure. If we do not reach you, we will leave a message.  We will attempt to reach you two times.  During this call, we will ask if you have developed any symptoms of COVID 19. If you develop any symptoms (ie: fever, flu-like symptoms, shortness of breath, cough etc.) before then, please call [phone].  If you test positive for Covid 19 in the 2 weeks post procedure, please call and report this information to [place].    If any biopsies were taken you will be contacted by phone or by letter within the next 1-3 weeks.  Please call us at [phone] if you have not heard about the biopsies in 3 weeks.    SIGNATURES/CONFIDENTIALITY: You and/or your care partner have signed paperwork which will be entered into your electronic medical record.  These signatures attest to the fact that that the information above on your After  Visit Summary has been reviewed and is understood.  Full responsibility of the confidentiality of this discharge information lies with you and/or your care-partner.

## 2022-01-07 NOTE — Progress Notes (Signed)
Sister? insisted on helping patient get dressed with my help.

## 2022-01-07 NOTE — Progress Notes (Signed)
Called to room to assist during endoscopic procedure.  Patient ID and intended procedure confirmed with present staff. Received instructions for my participation in the procedure from the performing physician.  

## 2022-01-07 NOTE — Progress Notes (Signed)
Pt's states no medical or surgical changes since previsit or office visit. 

## 2022-01-07 NOTE — Progress Notes (Signed)
Report to PACU, RN, vss, BBS= Clear.  

## 2022-01-07 NOTE — Progress Notes (Signed)
History and Physical:  This patient presents for endoscopic testing for: Encounter Diagnosis  Name Primary?   Special screening for malignant neoplasms, colon Yes    Last colonoscopy June 2012 - no polyps, + diverticulosis Patient denies chronic abdominal pain, rectal bleeding, constipation or diarrhea.   ROS: Patient denies chest pain or shortness of breath   Past Medical History: Past Medical History:  Diagnosis Date   Arthritis    Cerebral palsy (HCC)    GERD (gastroesophageal reflux disease)    Hammer toe    right   Hearing loss of both ears    Pt has bilateral hearing aids   Hypertension    Swollen L knee      Past Surgical History: Past Surgical History:  Procedure Laterality Date   COLONOSCOPY  05/01/2011   diverticulosis.  [place].   FOOT SURGERY     due to CP    Allergies: No Known Allergies  Outpatient Meds: Current Outpatient Medications  Medication Sig Dispense Refill   Cholecalciferol (VITAMIN D3 PO) Take by mouth daily.     diphenhydrAMINE (BENADRYL) 25 mg capsule Take 25 mg by mouth every 6 (six) hours as needed.     hydrochlorothiazide (HYDRODIURIL) 25 MG tablet Take 1 tablet (25 mg total) by mouth daily. 90 tablet 2   lisinopril (ZESTRIL) 20 MG tablet Take 1 tablet (20 mg total) by mouth daily. 90 tablet 2   naproxen (NAPROSYN) 375 MG tablet Take 1 tablet (375 mg total) by mouth daily as needed. 30 tablet 2   omeprazole (PRILOSEC) 40 MG capsule Take 1 capsule (40 mg total) by mouth daily. 90 capsule 2   tiZANidine (ZANAFLEX) 4 MG tablet Take 1 tablet (4 mg total) by mouth every 8 (eight) hours as needed for up to 90 doses. 90 tablet 2   fluticasone (FLONASE) 50 MCG/ACT nasal spray Place 2 sprays into both nostrils daily. (Patient not taking: Reported on 12/24/2021) 16 g 0   Current Facility-Administered Medications  Medication Dose Route Frequency Provider Last Rate Last Admin   0.9 %  sodium chloride infusion  500 mL Intravenous Once Nelida Meuse III, MD          ___________________________________________________________________ Objective   Exam:  BP 113/75    Pulse 74    Temp 98 F (36.7 C) (Skin)    Resp 17    Ht 5\' 8"  (1.727 m)    Wt 189 lb (85.7 kg)    SpO2 94%    BMI 28.74 kg/m   CV: RRR without murmur, S1/S2 Resp: clear to auscultation bilaterally, normal RR and effort noted GI: soft, no tenderness, with active bowel sounds.   Assessment: Encounter Diagnosis  Name Primary?   Special screening for malignant neoplasms, colon Yes     Plan: Colonoscopy  The benefits and risks of the planned procedure were described in detail with the patient or (when appropriate) their health care proxy.  Risks were outlined as including, but not limited to, bleeding, infection, perforation, adverse medication reaction leading to cardiac or pulmonary decompensation, pancreatitis (if ERCP).  The limitation of incomplete mucosal visualization was also discussed.  No guarantees or warranties were given.    The patient is appropriate for an endoscopic procedure in the ambulatory setting.   - Wilfrid Lund, MD

## 2022-01-09 ENCOUNTER — Telehealth: Payer: Self-pay | Admitting: *Deleted

## 2022-01-09 ENCOUNTER — Encounter: Payer: Self-pay | Admitting: Gastroenterology

## 2022-01-09 NOTE — Telephone Encounter (Signed)
°  Follow up Call-  Call back number 01/07/2022  Post procedure Call Back phone  # [phone]  Permission to leave phone message Yes  Some recent data might be hidden     Patient questions:  Do you have a fever, pain , or abdominal swelling? No. Pain Score  0 *  Have you tolerated food without any problems? Yes.    Have you been able to return to your normal activities? Yes.    Do you have any questions about your discharge instructions: Diet   No. Medications  No. Follow up visit  No.  Do you have questions or concerns about your Care? No.  Actions: * If pain score is 4 or above: No action needed, pain <4.

## 2022-01-20 ENCOUNTER — Other Ambulatory Visit: Payer: Self-pay | Admitting: Internal Medicine

## 2022-01-20 DIAGNOSIS — G801 Spastic diplegic cerebral palsy: Secondary | ICD-10-CM

## 2022-02-05 ENCOUNTER — Encounter: Payer: Medicare Other | Admitting: Gastroenterology

## 2022-02-19 ENCOUNTER — Other Ambulatory Visit: Payer: Self-pay | Admitting: Internal Medicine

## 2022-02-19 ENCOUNTER — Telehealth: Payer: Self-pay | Admitting: Student

## 2022-02-19 NOTE — Telephone Encounter (Signed)
On-call pager paged to call patient.  During the telephone encounter, patient reported back pain that started Monday.  Patient states his friend was falling off a chair and he tried to save him from hitting the ground. He started having some left-sided back pain right after the incident.  The pain is described as pulling and tightness in his back.  He reports some difficulty standing up due to the back pain after sitting on the toilet. The pain has not worsened but has not significantly improved.  He denies any numbness, fever, chills, tingling or weakness.  Patient reports taking his Zanaflex 4 mg once a day without significant improvement. I informed patient that his back pain is likely a muscle strain.  I advised patient to take his Zanaflex every 8 hours as needed for back pain/spasm as well as try some heating pad on his back.  Patient advised to call the clinic number if his symptoms worsen throughout the weekend.  ?

## 2022-03-03 ENCOUNTER — Ambulatory Visit (INDEPENDENT_AMBULATORY_CARE_PROVIDER_SITE_OTHER): Payer: Medicare Other | Admitting: Internal Medicine

## 2022-03-03 VITALS — BP 122/73 | HR 74 | Temp 98.3°F | Ht 68.0 in | Wt 187.2 lb

## 2022-03-03 DIAGNOSIS — I10 Essential (primary) hypertension: Secondary | ICD-10-CM

## 2022-03-03 DIAGNOSIS — G4700 Insomnia, unspecified: Secondary | ICD-10-CM

## 2022-03-03 DIAGNOSIS — Z791 Long term (current) use of non-steroidal anti-inflammatories (NSAID): Secondary | ICD-10-CM

## 2022-03-03 DIAGNOSIS — G801 Spastic diplegic cerebral palsy: Secondary | ICD-10-CM

## 2022-03-03 DIAGNOSIS — T148XXA Other injury of unspecified body region, initial encounter: Secondary | ICD-10-CM | POA: Diagnosis not present

## 2022-03-03 MED ORDER — RAMELTEON 8 MG PO TABS: 8.0000 mg | ORAL_TABLET | Freq: Every evening | ORAL | 0 refills | Status: DC | PRN

## 2022-03-03 NOTE — Patient Instructions (Signed)
Thank you, [name] for allowing [place] to provide your care today. Today we discussed back pain and insomnia.   ? ?Labs/Tests Ordered: ? ?Lab Orders    ?     BMP8+Anion Gap    ?     CBC no Diff     ? ?Referrals Ordered:  ?Referral Orders  ?No referral(s) requested today  ?  ? ?Medication Changes:  ?There are no discontinued medications.  ? ?Meds ordered this encounter  ?Medications  ? ramelteon (ROZEREM) 8 MG tablet  ?  Sig: Take 1 tablet (8 mg total) by mouth at bedtime as needed for up to 14 days for sleep.  ?  Dispense:  14 tablet  ?  Refill:  0  ?  ? ?Health Maintenance Screening: ?There are no preventive care reminders to display for this patient.  ? ?Instructions:  ? ?Follow up: 4-6 months  ? ?Remember: If you have any questions or concerns, call our clinic at [phone] or after hours call [phone] and ask for the internal medicine resident on call. ? ?Marianna Payment, D.O. ?[place] ? ? ? ?

## 2022-03-04 LAB — CBC
Hematocrit: 48.8 % (ref 37.5–51.0)
Hemoglobin: 16.1 g/dL (ref 13.0–17.7)
MCH: 30.6 pg (ref 26.6–33.0)
MCHC: 33 g/dL (ref 31.5–35.7)
MCV: 93 fL (ref 79–97)
Platelets: 283 10*3/uL (ref 150–450)
RBC: 5.27 x10E6/uL (ref 4.14–5.80)
RDW: 11.6 % (ref 11.6–15.4)
WBC: 10.4 10*3/uL (ref 3.4–10.8)

## 2022-03-04 LAB — BMP8+ANION GAP
Anion Gap: 16 mmol/L (ref 10.0–18.0)
BUN/Creatinine Ratio: 16 (ref 10–24)
BUN: 16 mg/dL (ref 8–27)
CO2: 22 mmol/L (ref 20–29)
Calcium: 9.6 mg/dL (ref 8.6–10.2)
Chloride: 103 mmol/L (ref 96–106)
Creatinine, Ser: 1 mg/dL (ref 0.76–1.27)
Glucose: 77 mg/dL (ref 70–99)
Potassium: 4.1 mmol/L (ref 3.5–5.2)
Sodium: 141 mmol/L (ref 134–144)
eGFR: 86 mL/min/{1.73_m2} (ref >59)

## 2022-03-05 DIAGNOSIS — T148XXA Other injury of unspecified body region, initial encounter: Secondary | ICD-10-CM | POA: Insufficient documentation

## 2022-03-05 DIAGNOSIS — G4700 Insomnia, unspecified: Secondary | ICD-10-CM | POA: Insufficient documentation

## 2022-03-05 DIAGNOSIS — Z791 Long term (current) use of non-steroidal anti-inflammatories (NSAID): Secondary | ICD-10-CM | POA: Insufficient documentation

## 2022-03-05 NOTE — Progress Notes (Signed)
? ? ?Subjective:  ?CC: muscle strain ? ?HPI: ? ?[name] is a 61 y.o. male with a past medical history stated below and presents today for muscle strain. Please see problem based assessment and plan for additional details. ? ?Past Medical History:  ?Diagnosis Date  ? Arthritis   ? Cerebral palsy ([place])   ? GERD (gastroesophageal reflux disease)   ? Hammer toe   ? right  ? Hearing loss of both ears   ? Pt has bilateral hearing aids  ? Hypertension   ? Swollen L knee   ? ? ?Current Outpatient Medications on File Prior to Visit  ?Medication Sig Dispense Refill  ? Cholecalciferol (VITAMIN D3 PO) Take by mouth daily.    ? fluticasone (FLONASE) 50 MCG/ACT nasal spray Place 2 sprays into both nostrils daily. (Patient not taking: Reported on 12/24/2021) 16 g 0  ? hydrochlorothiazide (HYDRODIURIL) 25 MG tablet Take 1 tablet by mouth once daily 90 tablet 0  ? lisinopril (ZESTRIL) 20 MG tablet Take 1 tablet by mouth once daily 90 tablet 0  ? naproxen (NAPROSYN) 375 MG tablet TAKE 1 TABLET BY MOUTH ONCE DAILY AS NEEDED 30 tablet 0  ? omeprazole (PRILOSEC) 40 MG capsule Take 1 capsule (40 mg total) by mouth daily. 90 capsule 2  ? tiZANidine (ZANAFLEX) 4 MG tablet Take 1 tablet (4 mg total) by mouth every 8 (eight) hours as needed for up to 90 doses. 90 tablet 2  ? ?No current facility-administered medications on file prior to visit.  ? ? ?Family History  ?Problem Relation Age of Onset  ? Prostate cancer Father   ? Colon polyps Neg Hx   ? Colon cancer Neg Hx   ? ? ?Social History  ? ?Socioeconomic History  ? Marital status: Single  ?  Spouse name: Not on file  ? Number of children: Not on file  ? Years of education: Not on file  ? Highest education level: Not on file  ?Occupational History  ? Not on file  ?Tobacco Use  ? Smoking status: Every Day  ?  Packs/day: 0.50  ?  Years: 15.00  ?  Pack years: 7.50  ?  Types: Cigarettes  ? Smokeless tobacco: Never  ? Tobacco comments:  ?  Smoking 8 cigs per day  ?Substance and  Sexual Activity  ? Alcohol use: Yes  ?  Alcohol/week: 3.0 standard drinks  ?  Types: 3 Standard drinks or equivalent per week  ? Drug use: No  ? Sexual activity: Not Currently  ?Other Topics Concern  ? Not on file  ?Social History Narrative  ? Not on file  ? ?Social Determinants of Health  ? ?Financial Resource Strain: Not on file  ?Food Insecurity: Not on file  ?Transportation Needs: Not on file  ?Physical Activity: Not on file  ?Stress: Not on file  ?Social Connections: Not on file  ?Intimate Partner Violence: Not on file  ? ? ?Review of Systems: ?ROS negative except for what is noted on the assessment and plan. ? ?Objective:  ? ?Vitals:  ? 03/03/22 1437  ?BP: 122/73  ?Pulse: 74  ?Temp: 98.3 ?F (36.8 ?C)  ?TempSrc: Oral  ?SpO2: 97%  ?Weight: 187 lb 3.2 oz (84.9 kg)  ?Height: '5\' 8"'$  (1.727 m)  ? ? ?Physical Exam: ?Gen: A&O x3 and in no apparent distress, well appearing and nourished. ?MSK: Grossly normal AROM and strength x4 extremities. ?Skin: good skin turgor, no rashes, unusual bruising, or prominent lesions.  ?Neuro: No  focal deficits, grossly normal sensation and coordination.  ?Psych: Oriented x3 and responding appropriately. Intact memory, normal mood, judgement, affect, and insight.  ? ? ?Assessment & Plan:  ?See Encounters Tab for problem based charting. ? ?Patient discussed with Dr. Jimmye Norman ? ? ?Marianna Payment, D.O. ?[facility]  PGY-3 ?Pager: [phone]  Phone: [phone] ?Date 03/05/2022  Time 9:40 AM ? ?

## 2022-03-05 NOTE — Assessment & Plan Note (Signed)
Perform CBC to assess for anemia in the setting chronic NSAID use.  ?

## 2022-03-05 NOTE — Assessment & Plan Note (Signed)
Will performed BMP for kidney function and electrolytes.  ?

## 2022-03-05 NOTE — Assessment & Plan Note (Addendum)
Patient presents with mixed insomnia. Difficulty with getting to sleep and staying asleep. He denies any significant day time somnolence or night time snoring. He does states that his daily activities have been decreased and feels it could be contributing. Denies ETOH or new medications. No evidence of depression.  ? ?Plan: ?- Gave information regarding sleep hygiene  ?- Short course of ramelteon  ?- Counseled to avoid using with his muscle relaxer  ?

## 2022-03-05 NOTE — Assessment & Plan Note (Signed)
Patient presents after a recent injury to his back after one of his friend fell forward on to him. He states that he caught him as he was falling. Initially he did not notice any pain, but in the following 2 days he developed left lower back pain. The pain was TTP and limited his motion. He called the on call resident pager who counseled him to continue his muscle relaxer and NSAIDs. Over the last week his pain has substantially improved and now feels back to baseline.  ? ?His physical exam is reassuring with full strength and ROM. No neuro deficits.  ? ?Plan:  ?- activity modifications  ?- OTC pain management ?- Counseled regarding return precautions. ?

## 2022-03-10 ENCOUNTER — Telehealth: Payer: Self-pay

## 2022-03-10 DIAGNOSIS — L603 Nail dystrophy: Secondary | ICD-10-CM | POA: Diagnosis not present

## 2022-03-10 DIAGNOSIS — L84 Corns and callosities: Secondary | ICD-10-CM | POA: Diagnosis not present

## 2022-03-10 DIAGNOSIS — I739 Peripheral vascular disease, unspecified: Secondary | ICD-10-CM | POA: Diagnosis not present

## 2022-03-10 NOTE — Telephone Encounter (Signed)
Requesting lab results, please call back.  

## 2022-03-13 NOTE — Progress Notes (Signed)
Internal Medicine Clinic Attending  Case discussed with Dr. Coe  At the time of the visit.  We reviewed the resident's history and exam and pertinent patient test results.  I agree with the assessment, diagnosis, and plan of care documented in the resident's note.  

## 2022-04-05 ENCOUNTER — Other Ambulatory Visit: Payer: Self-pay | Admitting: Internal Medicine

## 2022-04-05 DIAGNOSIS — G801 Spastic diplegic cerebral palsy: Secondary | ICD-10-CM

## 2022-04-21 ENCOUNTER — Other Ambulatory Visit: Payer: Self-pay | Admitting: Internal Medicine

## 2022-05-08 ENCOUNTER — Other Ambulatory Visit: Payer: Self-pay | Admitting: Internal Medicine

## 2022-05-08 DIAGNOSIS — G801 Spastic diplegic cerebral palsy: Secondary | ICD-10-CM

## 2022-05-23 ENCOUNTER — Encounter: Payer: Self-pay | Admitting: *Deleted

## 2022-06-04 ENCOUNTER — Other Ambulatory Visit: Payer: Self-pay | Admitting: Student

## 2022-06-04 DIAGNOSIS — G801 Spastic diplegic cerebral palsy: Secondary | ICD-10-CM

## 2022-06-17 DIAGNOSIS — L603 Nail dystrophy: Secondary | ICD-10-CM | POA: Diagnosis not present

## 2022-06-17 DIAGNOSIS — I739 Peripheral vascular disease, unspecified: Secondary | ICD-10-CM | POA: Diagnosis not present

## 2022-06-17 DIAGNOSIS — L84 Corns and callosities: Secondary | ICD-10-CM | POA: Diagnosis not present

## 2022-06-29 DIAGNOSIS — H905 Unspecified sensorineural hearing loss: Secondary | ICD-10-CM | POA: Diagnosis not present

## 2022-08-03 ENCOUNTER — Other Ambulatory Visit: Payer: Self-pay | Admitting: Internal Medicine

## 2022-08-06 ENCOUNTER — Other Ambulatory Visit: Payer: Self-pay

## 2022-08-06 DIAGNOSIS — G802 Spastic hemiplegic cerebral palsy: Secondary | ICD-10-CM

## 2022-08-06 DIAGNOSIS — G801 Spastic diplegic cerebral palsy: Secondary | ICD-10-CM

## 2022-08-07 MED ORDER — LISINOPRIL 20 MG PO TABS: 20.0000 mg | ORAL_TABLET | Freq: Every day | ORAL | 1 refills | Status: DC

## 2022-08-07 MED ORDER — TIZANIDINE HCL 4 MG PO TABS: 4.0000 mg | ORAL_TABLET | Freq: Three times a day (TID) | ORAL | 2 refills | Status: DC | PRN

## 2022-08-07 MED ORDER — HYDROCHLOROTHIAZIDE 25 MG PO TABS: 25.0000 mg | ORAL_TABLET | Freq: Every day | ORAL | 1 refills | Status: DC

## 2022-08-07 MED ORDER — NAPROXEN 375 MG PO TABS: 375.0000 mg | ORAL_TABLET | Freq: Every day | ORAL | 1 refills | Status: DC | PRN

## 2022-10-08 ENCOUNTER — Ambulatory Visit (INDEPENDENT_AMBULATORY_CARE_PROVIDER_SITE_OTHER): Payer: Medicare Other

## 2022-10-08 ENCOUNTER — Ambulatory Visit (INDEPENDENT_AMBULATORY_CARE_PROVIDER_SITE_OTHER): Payer: Medicare Other | Admitting: Student

## 2022-10-08 ENCOUNTER — Encounter: Payer: Self-pay | Admitting: Student

## 2022-10-08 ENCOUNTER — Other Ambulatory Visit: Payer: Self-pay

## 2022-10-08 VITALS — BP 118/64 | HR 81 | Temp 97.6°F | Ht 68.0 in | Wt 185.8 lb

## 2022-10-08 DIAGNOSIS — I10 Essential (primary) hypertension: Secondary | ICD-10-CM | POA: Diagnosis not present

## 2022-10-08 DIAGNOSIS — Z0000 Encounter for general adult medical examination without abnormal findings: Secondary | ICD-10-CM

## 2022-10-08 DIAGNOSIS — R053 Chronic cough: Secondary | ICD-10-CM

## 2022-10-08 DIAGNOSIS — J302 Other seasonal allergic rhinitis: Secondary | ICD-10-CM

## 2022-10-08 DIAGNOSIS — T7840XA Allergy, unspecified, initial encounter: Secondary | ICD-10-CM

## 2022-10-08 DIAGNOSIS — G801 Spastic diplegic cerebral palsy: Secondary | ICD-10-CM

## 2022-10-08 DIAGNOSIS — Z791 Long term (current) use of non-steroidal anti-inflammatories (NSAID): Secondary | ICD-10-CM | POA: Diagnosis not present

## 2022-10-08 MED ORDER — CETIRIZINE HCL 10 MG PO TABS: 10.0000 mg | ORAL_TABLET | Freq: Every day | ORAL | 2 refills | Status: AC

## 2022-10-08 NOTE — Patient Instructions (Signed)
Health Maintenance, Male Adopting a healthy lifestyle and getting preventive care are important in promoting health and wellness. Ask your health care provider about: The right schedule for you to have regular tests and exams. Things you can do on your own to prevent diseases and keep yourself healthy. What should I know about diet, weight, and exercise? Eat a healthy diet  Eat a diet that includes plenty of vegetables, fruits, low-fat dairy products, and lean protein. Do not eat a lot of foods that are high in solid fats, added sugars, or sodium. Maintain a healthy weight Body mass index (BMI) is a measurement that can be used to identify possible weight problems. It estimates body fat based on height and weight. Your health care provider can help determine your BMI and help you achieve or maintain a healthy weight. Get regular exercise Get regular exercise. This is one of the most important things you can do for your health. Most adults should: Exercise for at least 150 minutes each week. The exercise should increase your heart rate and make you sweat (moderate-intensity exercise). Do strengthening exercises at least twice a week. This is in addition to the moderate-intensity exercise. Spend less time sitting. Even light physical activity can be beneficial. Watch cholesterol and blood lipids Have your blood tested for lipids and cholesterol at 62 years of age, then have this test every 5 years. You may need to have your cholesterol levels checked more often if: Your lipid or cholesterol levels are high. You are older than 62 years of age. You are at high risk for heart disease. What should I know about cancer screening? Many types of cancers can be detected early and may often be prevented. Depending on your health history and family history, you may need to have cancer screening at various ages. This may include screening for: Colorectal cancer. Prostate cancer. Skin cancer. Lung  cancer. What should I know about heart disease, diabetes, and high blood pressure? Blood pressure and heart disease High blood pressure causes heart disease and increases the risk of stroke. This is more likely to develop in people who have high blood pressure readings or are overweight. Talk with your health care provider about your target blood pressure readings. Have your blood pressure checked: Every 3-5 years if you are 18-39 years of age. Every year if you are 40 years old or older. If you are between the ages of 65 and 75 and are a current or former smoker, ask your health care provider if you should have a one-time screening for abdominal aortic aneurysm (AAA). Diabetes Have regular diabetes screenings. This checks your fasting blood sugar level. Have the screening done: Once every three years after age 45 if you are at a normal weight and have a low risk for diabetes. More often and at a younger age if you are overweight or have a high risk for diabetes. What should I know about preventing infection? Hepatitis B If you have a higher risk for hepatitis B, you should be screened for this virus. Talk with your health care provider to find out if you are at risk for hepatitis B infection. Hepatitis C Blood testing is recommended for: Everyone born from 1945 through 1965. Anyone with known risk factors for hepatitis C. Sexually transmitted infections (STIs) You should be screened each year for STIs, including gonorrhea and chlamydia, if: You are sexually active and are younger than 62 years of age. You are older than 62 years of age and your   health care provider tells you that you are at risk for this type of infection. Your sexual activity has changed since you were last screened, and you are at increased risk for chlamydia or gonorrhea. Ask your health care provider if you are at risk. Ask your health care provider about whether you are at high risk for HIV. Your health care provider  may recommend a prescription medicine to help prevent HIV infection. If you choose to take medicine to prevent HIV, you should first get tested for HIV. You should then be tested every 3 months for as long as you are taking the medicine. Follow these instructions at home: Alcohol use Do not drink alcohol if your health care provider tells you not to drink. If you drink alcohol: Limit how much you have to 0-2 drinks a day. Know how much alcohol is in your drink. In the [place] one drink equals one 12 oz bottle of beer (355 mL), one 5 oz glass of wine (148 mL), or one 1 oz glass of hard liquor (44 mL). Lifestyle Do not use any products that contain nicotine or tobacco. These products include cigarettes, chewing tobacco, and vaping devices, such as e-cigarettes. If you need help quitting, ask your health care provider. Do not use street drugs. Do not share needles. Ask your health care provider for help if you need support or information about quitting drugs. General instructions Schedule regular health, dental, and eye exams. Stay current with your vaccines. Tell your health care provider if: You often feel depressed. You have ever been abused or do not feel safe at home. Summary Adopting a healthy lifestyle and getting preventive care are important in promoting health and wellness. Follow your health care provider's instructions about healthy diet, exercising, and getting tested or screened for diseases. Follow your health care provider's instructions on monitoring your cholesterol and blood pressure. This information is not intended to replace advice given to you by your health care provider. Make sure you discuss any questions you have with your health care provider. Document Revised: 04/07/2021 Document Reviewed: 04/07/2021 Elsevier Patient Education  2023 [employer]  

## 2022-10-08 NOTE — Patient Instructions (Signed)
Mr.Caven E Schexnider, it was a pleasure seeing you today!  Today we discussed: - Allergies: At [facility], find the medication called cetirizine '10mg'$  each.    - I am going to check lab work today. I will call you with the results.  I have ordered the following labs today:   Lab Orders         BMP8+Anion Gap         CBC no Diff      Follow-up: 6 months   Please make sure to arrive 15 minutes prior to your next appointment. If you arrive late, you may be asked to reschedule.   We look forward to seeing you next time. Please call our clinic at [phone] if you have any questions or concerns. The best time to call is Monday-Friday from 9am-4pm, but there is someone available 24/7. If after hours or the weekend, call the main hospital number and ask for the Internal Medicine Resident On-Call. If you need medication refills, please notify your pharmacy one week in advance and they will send [place] a request.  Thank you for letting us take part in your care. Wishing you the best!  Thank you, Sanjuan Dame, MD

## 2022-10-08 NOTE — Progress Notes (Signed)
Subjective:   Dustin Mora is a 61 y.o. male who presents for an Initial Medicare Annual Wellness Visit. I connected with  Larwance Sachs on 10/08/22 by a  IN PERSON [place]    Patient Location: Other:  IN PERSON [place]   Provider Location: Office/Clinic  I discussed the limitations of evaluation and management by telemedicine. The patient expressed understanding and agreed to proceed.  Review of Systems    DEFERRED TO PCP  Cardiac Risk Factors include: advanced age (>76mn, >>70women);hypertension     Objective:    Today's Vitals   10/08/22 1427  PainSc: 2    There is no height or weight on file to calculate BMI.     10/08/2022    2:39 PM 10/08/2022    1:58 PM 03/03/2022    2:39 PM 11/12/2021    9:56 AM 05/27/2021    4:05 PM 09/19/2020    2:26 PM 09/05/2020    3:04 PM  Advanced Directives  Does Patient Have a Medical Advance Directive? No No No No No No No  Would patient like information on creating a medical advance directive? No - Patient declined No - Patient declined No - Patient declined No - Patient declined No - Patient declined No - Patient declined No - Patient declined    Current Medications (verified) Outpatient Encounter Medications as of 10/08/2022  Medication Sig   Cholecalciferol (VITAMIN D3 PO) Take by mouth daily.   fluticasone (FLONASE) 50 MCG/ACT nasal spray Place 2 sprays into both nostrils daily.   hydrochlorothiazide (HYDRODIURIL) 25 MG tablet Take 1 tablet (25 mg total) by mouth daily.   lisinopril (ZESTRIL) 20 MG tablet Take 1 tablet (20 mg total) by mouth daily.   naproxen (NAPROSYN) 375 MG tablet Take 1 tablet (375 mg total) by mouth daily as needed.   omeprazole (PRILOSEC) 40 MG capsule Take 1 capsule by mouth once daily   tiZANidine (ZANAFLEX) 4 MG tablet Take 1 tablet (4 mg total) by mouth every 8 (eight) hours as needed for up to 270 doses.   ramelteon (ROZEREM) 8 MG tablet Take 1 tablet (8 mg total) by mouth at bedtime as needed  for up to 14 days for sleep.   No facility-administered encounter medications on file as of 10/08/2022.    Allergies (verified) Patient has no known allergies.   History: Past Medical History:  Diagnosis Date   Arthritis    Cerebral palsy (HCC)    GERD (gastroesophageal reflux disease)    Hammer toe    right   Hearing loss of both ears    Pt has bilateral hearing aids   Hypertension    Swollen L knee    Past Surgical History:  Procedure Laterality Date   COLONOSCOPY  05/01/2011   diverticulosis.  [place].   FOOT SURGERY     due to CP   Family History  Problem Relation Age of Onset   Prostate cancer Father    Colon polyps Neg Hx    Colon cancer Neg Hx    Social History   Socioeconomic History   Marital status: Single    Spouse name: Not on file   Number of children: Not on file   Years of education: Not on file   Highest education level: Not on file  Occupational History   Not on file  Tobacco Use   Smoking status: Every Day    Packs/day: 0.50    Years: 15.00    Total pack  years: 7.50    Types: Cigarettes   Smokeless tobacco: Never   Tobacco comments:    Smoking 8 cigs per day  Substance and Sexual Activity   Alcohol use: Yes    Alcohol/week: 3.0 standard drinks of alcohol    Types: 3 Standard drinks or equivalent per week   Drug use: No   Sexual activity: Not Currently  Other Topics Concern   Not on file  Social History Narrative   Not on file   Social Determinants of Health   Financial Resource Strain: Low Risk  (10/08/2022)   Overall Financial Resource Strain (CARDIA)    Difficulty of Paying Living Expenses: Not hard at all  Food Insecurity: No Food Insecurity (10/08/2022)   Hunger Vital Sign    Worried About Running Out of Food in the Last Year: Never true    Ran Out of Food in the Last Year: Never true  Transportation Needs: No Transportation Needs (10/08/2022)   PRAPARE - Hydrologist (Medical): No    Lack of  Transportation (Non-Medical): No  Physical Activity: Inactive (10/08/2022)   Exercise Vital Sign    Days of Exercise per Week: 0 days    Minutes of Exercise per Session: 0 min  Stress: No Stress Concern Present (10/08/2022)   [place] [employer] [place]    Feeling of Stress : Not at all  Social Connections: Moderately Isolated (10/08/2022)   Social Connection and Isolation Panel [NHANES]    Frequency of Communication with Friends and Family: More than three times a week    Frequency of Social Gatherings with Friends and Family: Twice a week    Attends Religious Services: More than 4 times per year    Active Member of [employer] or Organizations: No    Attends Music therapist: Never    Marital Status: Never married    Tobacco Counseling Ready to quit: Not Answered Counseling given: Not Answered Tobacco comments: Smoking 8 cigs per day   Clinical Intake:  Pre-visit preparation completed: Yes  Pain : 0-10 Pain Score: 2  Pain Type: Chronic pain Pain Location: Other (Comment) (SORE) Pain Descriptors / Indicators: Sore Pain Onset: More than a month ago     BMI - recorded: 28 Nutritional Status: BMI 25 -29 Overweight Nutritional Risks: None Diabetes: No  How often do you need to have someone help you when you read instructions, pamphlets, or other written materials from your doctor or pharmacy?: 1 - Never What is the last grade level you completed in school?: 12 GRADE  Diabetic?NO   Interpreter Needed?: No  Information entered by :: [facility] Latise Dilley   Activities of Daily Living    10/08/2022    2:39 PM 10/08/2022    1:57 PM  In your present state of health, do you have any difficulty performing the following activities:  Hearing? 1 1  Vision? 0 0  Difficulty concentrating or making decisions? 0 0  Walking or climbing stairs? 1 1  Dressing or bathing? 0 0  Doing errands, shopping? 0 0  Preparing Food and eating  ? N   Using the Toilet? N   In the past six months, have you accidently leaked urine? N   Do you have problems with loss of bowel control? N   Managing your Medications? N   Managing your Finances? N   Housekeeping or managing your Housekeeping? N     Patient Care Team: Lacinda Axon, MD  as PCP - General (Internal Medicine) Rana Snare, MD (Inactive) as Consulting Physician (Urology)  Indicate any recent Medical Services you may have received from other than [facility] providers in the past year (date may be approximate).     Assessment:   This is a routine wellness examination for Dustin Mora.  Hearing/Vision screen No results found.  Dietary issues and exercise activities discussed: Current Exercise Habits: The patient does not participate in regular exercise at present, Exercise limited by: Other - see comments   Goals Addressed   None   Depression Screen    10/08/2022    2:38 PM 10/08/2022    1:57 PM 10/08/2022    1:53 PM 11/12/2021    9:54 AM 05/27/2021    4:08 PM 09/19/2020    2:26 PM 09/05/2020    4:49 PM  PHQ 2/9 Scores  PHQ - 2 Score 0 0 0 0 0 0 0  PHQ- 9 Score    0  0 3    Fall Risk    10/08/2022    2:39 PM 10/08/2022    1:57 PM 03/03/2022    2:40 PM 11/12/2021    9:53 AM 09/19/2020    2:24 PM  Fall Risk   Falls in the past year? 0 0 0 0 0  Number falls in past yr: 0 0 0 0   Injury with Fall? 0 0 0 0   Risk for fall due to : Impaired balance/gait Impaired balance/gait  Impaired balance/gait History of fall(s);Impaired balance/gait;Impaired mobility  Follow up Falls evaluation completed;Falls prevention discussed Falls evaluation completed;Falls prevention discussed Falls evaluation completed Falls evaluation completed Falls prevention discussed    FALL RISK PREVENTION PERTAINING TO THE HOME:  Any stairs in or around the home? No  If so, are there any without handrails? No  Home free of loose throw rugs in walkways, pet beds, electrical cords, etc? Yes   Adequate lighting in your home to reduce risk of falls? Yes   ASSISTIVE DEVICES UTILIZED TO PREVENT FALLS:  Life alert? No  Use of a cane, walker or w/c? Yes  Grab bars in the bathroom? Yes  Shower chair or bench in shower? Yes  Elevated toilet seat or a handicapped toilet? No   TIMED UP AND GO:  Was the test performed? No .  Length of time to ambulate 10 feet: N/A  sec.     Cognitive Function:        10/08/2022    2:40 PM  6CIT Screen  What Year? 0 points  What month? 0 points  What time? 0 points  Count back from 20 0 points  Months in reverse 0 points  Repeat phrase 0 points  Total Score 0 points    Immunizations Immunization History  Administered Date(s) Administered   Influenza,inj,Quad PF,6+ Mos 09/21/2018, 09/05/2020   Influenza-Unspecified 07/31/2013   PFIZER(Purple Top)SARS-COV-2 Vaccination 03/14/2020, 04/09/2020    TDAP status: Due, Education has been provided regarding the importance of this vaccine. Advised may receive this vaccine at local pharmacy or Health Dept. Aware to provide a copy of the vaccination record if obtained from local pharmacy or Health Dept. Verbalized acceptance and understanding.  Flu Vaccine status: Due, Education has been provided regarding the importance of this vaccine. Advised may receive this vaccine at local pharmacy or Health Dept. Aware to provide a copy of the vaccination record if obtained from local pharmacy or Health Dept. Verbalized acceptance and understanding.  Pneumococcal vaccine status: Due, Education has been provided regarding  the importance of this vaccine. Advised may receive this vaccine at local pharmacy or Health Dept. Aware to provide a copy of the vaccination record if obtained from local pharmacy or Health Dept. Verbalized acceptance and understanding.  Covid-19 vaccine status: Completed vaccines  Qualifies for Shingles Vaccine? Yes   Zostavax completed No   Shingrix Completed?: No.    Education has  been provided regarding the importance of this vaccine. Patient has been advised to call insurance company to determine out of pocket expense if they have not yet received this vaccine. Advised may also receive vaccine at local pharmacy or Health Dept. Verbalized acceptance and understanding.  Screening Tests Health Maintenance  Topic Date Due   TETANUS/TDAP  Never done   Zoster Vaccines- Shingrix (1 of 2) Never done   COVID-19 Vaccine (3 - Pfizer series) 06/04/2020   INFLUENZA VACCINE  06/30/2022   Medicare Annual Wellness (AWV)  10/09/2023   COLONOSCOPY (Pts 45-44yr Insurance coverage will need to be confirmed)  01/07/2029   Hepatitis C Screening  Completed   HIV Screening  Completed   HPV VACCINES  Aged Out    Health Maintenance  Health Maintenance Due  Topic Date Due   TETANUS/TDAP  Never done   Zoster Vaccines- Shingrix (1 of 2) Never done   COVID-19 Vaccine (3 - Pfizer series) 06/04/2020   INFLUENZA VACCINE  06/30/2022    Colorectal cancer screening: Type of screening: Colonoscopy. Completed 01/07/2022. Repeat every 7 years  Lung Cancer Screening: (Low Dose CT Chest recommended if Age 62-80years, 30 pack-year currently smoking OR have quit w/in 15years.) does not qualify.   Lung Cancer Screening Referral: DEFERRED TO PCP   Additional Screening:  Hepatitis C Screening: does qualify; Completed 09/05/2020  Vision Screening: Recommended annual ophthalmology exams for early detection of glaucoma and other disorders of the eye. Is the patient up to date with their annual eye exam?  Yes  Who is the provider or what is the name of the office in which the patient attends annual eye exams? [facility] If pt is not established with a provider, would they like to be referred to a provider to establish care? No .   Dental Screening: Recommended annual dental exams for proper oral hygiene  Community Resource Referral / Chronic Care Management: CRR required this visit?  No    CCM required this visit?  No      Plan:     I have personally reviewed and noted the following in the patient's chart:   Medical and social history Use of alcohol, tobacco or illicit drugs  Current medications and supplements including opioid prescriptions. Patient is not currently taking opioid prescriptions. Functional ability and status Nutritional status Physical activity Advanced directives List of other physicians Hospitalizations, surgeries, and ER visits in previous 12 months Vitals Screenings to include cognitive, depression, and falls Referrals and appointments  In addition, I have reviewed and discussed with patient certain preventive protocols, quality metrics, and best practice recommendations. A written personalized care plan for preventive services as well as general preventive health recommendations were provided to patient.     aJudyann Munson CMA   10/08/2022   N[facility] Notes[facility]   Mr. CLesch, Thank you for taking time to come for your Medicare Wellness Visit. I appreciate your ongoing commitment to your health goals. Please review the following plan we discussed and let me know if I can assist you in the future.   These  are the goals we discussed:  Goals   None     This is a list of the screening recommended for you and due dates:  Health Maintenance  Topic Date Due   Tetanus Vaccine  Never done   Zoster (Shingles) Vaccine (1 of 2) Never done   COVID-19 Vaccine (3 - Pfizer series) 06/04/2020   Flu Shot  06/30/2022   Medicare Annual Wellness Visit  10/09/2023   Colon Cancer Screening  01/07/2029   Hepatitis C Screening: USPSTF Recommendation to screen - Ages 18-79 yo.  Completed   HIV Screening  Completed   HPV Vaccine  Aged Out

## 2022-10-09 ENCOUNTER — Encounter: Payer: Medicare Other | Admitting: Student

## 2022-10-09 LAB — BMP8+ANION GAP
Anion Gap: 19 mmol/L — ABNORMAL HIGH (ref 10.0–18.0)
BUN/Creatinine Ratio: 15 (ref 10–24)
BUN: 19 mg/dL (ref 8–27)
CO2: 21 mmol/L (ref 20–29)
Calcium: 9.9 mg/dL (ref 8.6–10.2)
Chloride: 100 mmol/L (ref 96–106)
Creatinine, Ser: 1.26 mg/dL (ref 0.76–1.27)
Glucose: 85 mg/dL (ref 70–99)
Potassium: 4.3 mmol/L (ref 3.5–5.2)
Sodium: 140 mmol/L (ref 134–144)
eGFR: 64 mL/min/{1.73_m2} (ref >59)

## 2022-10-09 LAB — CBC
Hematocrit: 44.8 % (ref 37.5–51.0)
Hemoglobin: 14.8 g/dL (ref 13.0–17.7)
MCH: 31.1 pg (ref 26.6–33.0)
MCHC: 33 g/dL (ref 31.5–35.7)
MCV: 94 fL (ref 79–97)
Platelets: 282 10*3/uL (ref 150–450)
RBC: 4.76 x10E6/uL (ref 4.14–5.80)
RDW: 11.6 % (ref 11.6–15.4)
WBC: 11.1 10*3/uL — ABNORMAL HIGH (ref 3.4–10.8)

## 2022-10-12 DIAGNOSIS — J302 Other seasonal allergic rhinitis: Secondary | ICD-10-CM | POA: Insufficient documentation

## 2022-10-12 NOTE — Assessment & Plan Note (Signed)
-   Patient would like to defer getting Tdap and influenza vaccinations at this time

## 2022-10-12 NOTE — Assessment & Plan Note (Addendum)
Dustin Mora is presenting today to discuss recent cough that he has had in the morning times. Reports that for the last few weeks he has woken up with cough with clear phlegm and feeling some congestion in his chest. Sometimes will have sore throat, denies runny nose, tearing of eyes. He does report taking Flonase sometimes in the evenings, which sometimes helps his symptoms. He denies any sick contacts, fevers, chills, sinus pressure, nausea, vomiting.   Dustin Mora symptoms most consistent with seasonal allergies. We will plan to initiate daily anti-histamine and daily intranasal glucocorticoid.   - Daily cetirizine '10mg'$  daily - Daily Flonase

## 2022-10-12 NOTE — Progress Notes (Signed)
Internal Medicine Clinic Attending ? ?Case discussed with Dr. Braswell  At the time of the visit.  We reviewed the resident?s history and exam and pertinent patient test results.  I agree with the assessment, diagnosis, and plan of care documented in the resident?s note.  ?

## 2022-10-12 NOTE — Assessment & Plan Note (Signed)
Dustin Mora reports that he has been on naproxen for a long time and feels as though he continues to require this. He does report his pain is adequately controlled and has not had any epigastric pain or acid reflux symptoms. He does note that he feels as though his legs are stiffer than they were previously, but his pain is under control.  Dustin Mora and I had long discussion regarding long-term NSAID use, including discussing risks involved. Previous PCP discussed switching to gabapentin, but he is hesitant given his symptoms are under control with his current regimen. We discussed today continuing his current medication and will continue monitoring for symptoms and regularly checking renal function, CBC.   - CBC, BMP today - Naproxen '375mg'$  daily - Tizanidine '4mg'$  every 8 hours as needed

## 2022-10-12 NOTE — Progress Notes (Signed)
   CC: cough  HPI:  Dustin Mora is a 61 y.o. person with medical history as below presenting to [facility] for cough.  Please see problem-based list for further details, assessments, and plans.  Past Medical History:  Diagnosis Date   Arthritis    Cerebral palsy (HCC)    GERD (gastroesophageal reflux disease)    Hammer toe    right   Hearing loss of both ears    Pt has bilateral hearing aids   Hypertension    Swollen L knee    Review of Systems:  As per HPI  Physical Exam:  Vitals:   10/08/22 1340  BP: 118/64  Pulse: 81  Temp: 97.6 F (36.4 C)  TempSrc: Oral  SpO2: 98%  Weight: 185 lb 12.8 oz (84.3 kg)  Height: '5\' 8"'$  (1.727 m)   General: Resting comfortably in no acute distress HENT: Normocephalic, atraumatic. Nasal turbinates non-erythematous, no polyps appreciated. No tonsillar exudates. No cervical lymphadenopathy.  Eyes: Vision grossly in tact. No scleral icterus or conjunctival injection. CV: Regular rate, rhythm. No murmurs appreciated. Warm extremities. Pulm: Normal work of breathing on room air. Clear to auscultation bilaterally.  MSK: Normal bulk, tone. No peripheral edema noted. Skin: Warm, dry. No rashes or lesions appreciated. Neuro: Awake, alert, conversing appropriately. Grossly in tact. Psych: Normal mood, affect, speech.   Assessment & Plan:   Seasonal allergies Dustin Mora is presenting today to discuss recent cough that he has had in the morning times. Reports that for the last few weeks he has woken up with cough with clear phlegm and feeling some congestion in his chest. Sometimes will have sore throat, denies runny nose, tearing of eyes. He does report taking Flonase sometimes in the evenings, which sometimes helps his symptoms. He denies any sick contacts, fevers, chills, sinus pressure, nausea, vomiting.   Dustin Mora symptoms most consistent with seasonal allergies. We will plan to initiate daily anti-histamine and daily intranasal  glucocorticoid.   - Daily cetirizine '10mg'$  daily - Daily Flonase  Cerebral palsy complicated by spastic diplegia Dustin Mora reports that he has been on naproxen for a long time and feels as though he continues to require this. He does report his pain is adequately controlled and has not had any epigastric pain or acid reflux symptoms. He does note that he feels as though his legs are stiffer than they were previously, but his pain is under control.  Dustin Mora and I had long discussion regarding long-term NSAID use, including discussing risks involved. Previous PCP discussed switching to gabapentin, but he is hesitant given his symptoms are under control with his current regimen. We discussed today continuing his current medication and will continue monitoring for symptoms and regularly checking renal function, CBC.   - CBC, BMP today - Naproxen '375mg'$  daily - Tizanidine '4mg'$  every 8 hours as needed  Healthcare maintenance - Patient would like to defer getting Tdap and influenza vaccinations at this time  Patient discussed with Dr. Kathrynn Ducking, MD Internal Medicine PGY-3 Pager: [phone]

## 2022-10-12 NOTE — Addendum Note (Signed)
Addended by: Jodean Lima on: 10/12/2022 09:55 AM   Modules accepted: Level of Service

## 2022-10-15 ENCOUNTER — Encounter: Payer: Self-pay | Admitting: Student

## 2022-10-19 NOTE — Progress Notes (Signed)
Internal Medicine Clinic Attending  Case and documentation of Dr. Braswell reviewed.  I reviewed the AWV findings.  I agree with the assessment, diagnosis, and plan of care documented in the AWV note.     

## 2023-02-09 ENCOUNTER — Other Ambulatory Visit: Payer: Self-pay | Admitting: Student

## 2023-02-11 ENCOUNTER — Other Ambulatory Visit: Payer: Self-pay

## 2023-02-11 DIAGNOSIS — G801 Spastic diplegic cerebral palsy: Secondary | ICD-10-CM

## 2023-02-11 NOTE — Telephone Encounter (Addendum)
Called pt to schedule an appt - no answer; left message on self-identified  vm to call the office to schedule an appt for next month.

## 2023-02-11 NOTE — Telephone Encounter (Signed)
Please call patient to schedule a follow up with me next month.

## 2023-02-18 ENCOUNTER — Encounter: Payer: Self-pay | Admitting: Student

## 2023-02-18 ENCOUNTER — Ambulatory Visit (HOSPITAL_COMMUNITY)
Admission: RE | Admit: 2023-02-18 | Discharge: 2023-02-18 | Disposition: A | Discharge status: Home / Self Care | Payer: 59 | Source: Ambulatory Visit | Attending: Student in an Organized Health Care Education/Training Program | Admitting: Student in an Organized Health Care Education/Training Program

## 2023-02-18 ENCOUNTER — Ambulatory Visit (INDEPENDENT_AMBULATORY_CARE_PROVIDER_SITE_OTHER): Payer: 59 | Admitting: Student

## 2023-02-18 VITALS — BP 118/63 | HR 70 | Temp 97.7°F | Ht 68.0 in | Wt 190.4 lb

## 2023-02-18 DIAGNOSIS — M25561 Pain in right knee: Secondary | ICD-10-CM | POA: Diagnosis present

## 2023-02-18 MED ORDER — DICLOFENAC SODIUM 1 % EX GEL: 2.0000 g | Freq: Four times a day (QID) | CUTANEOUS | 2 refills | Status: AC

## 2023-02-18 MED ORDER — NAPROXEN 375 MG PO TABS: 375.0000 mg | ORAL_TABLET | Freq: Every day | ORAL | 1 refills | Status: DC | PRN

## 2023-02-18 NOTE — Patient Instructions (Signed)
Thank you, [name] for allowing [place] to provide your care today. Today we discussed your right knee pain.  I am concerned for possible osteoarthritis of your right knee so I am getting imaging to further evaluate.  I have ordered the following tests: Right knee Xray  I have ordered the following medication/changed the following medications:  Apply Voltaren 1% gel to right knee 4 times daily  My Chart Access: [email or web address]  Please follow-up in 3-6 months  Please make sure to arrive 15 minutes prior to your next appointment. If you arrive late, you may be asked to reschedule.    We look forward to seeing you next time. Please call our clinic at [phone] if you have any questions or concerns. The best time to call is Monday-Friday from 9am-4pm, but there is someone available 24/7. If after hours or the weekend, call the main hospital number and ask for the Internal Medicine Resident On-Call. If you need medication refills, please notify your pharmacy one week in advance and they will send [place] a request.   Thank you for letting us take part in your care. Wishing you the best!  Lacinda Axon, MD 02/18/2023, 3:59 PM IM Resident, PGY-3 [facility] 41:10

## 2023-02-18 NOTE — Progress Notes (Signed)
   CC: Right knee pain  HPI:  [name] is a 62 y.o. male with PMH as below who presents to clinic for evaluation of right knee pain. Please see problem based charting for evaluation, assessment and plan.  Past Medical History:  Diagnosis Date   Arthritis    Cerebral palsy (HCC)    GERD (gastroesophageal reflux disease)    Hammer toe    right   Hearing loss of both ears    Pt has bilateral hearing aids   Hypertension    Swollen L knee     Review of Systems:  Constitutional: Negative for fever, falls or fatigue Eyes: Negative for visual changes Respiratory: Negative for shortness of breath MSK: Positive for right knee pain.  Negative for trauma or back pain Neuro: Negative for headache or weakness  Physical Exam: General: Pleasant, well-appearing elderly man in a wheelchair.  No acute distress. Cardiac: RRR. No murmurs, rubs or gallops. No LE edema Respiratory: Lungs CTAB. No wheezing or crackles. Abdominal: Soft, symmetric and non tender. Normal BS. Skin: Warm, dry and intact without rashes or lesions MSK: Mild hyperreflexia of the lower extremities. Right knee with no tenderness to palpation, crepitus or effusion. Limited extension and flexion of the knee but no instability. Extremities: Atraumatic. Full ROM. Palpable radial and DP pulses. Neuro: A&O x 3. Moves all extremities.  Normal sensation to gross touch. Psych: Appropriate mood and affect.  Vitals:   02/18/23 1511 02/18/23 1512 02/18/23 1610  BP:  100/63 118/63  Pulse:  79 70  Temp:  97.7 F (36.5 C)   TempSrc:  Oral   SpO2:  96%   Weight: 190 lb 6.4 oz (86.4 kg)    Height: 5\' 8"  (1.727 m)      Assessment & Plan:   Right knee pain Patient with a history of cerebral palsy who currently ambulates with a cane presenting for evaluation of right knee pain. Patient reports he has had right knee pain for the last month described as aching and intermittent in nature. It is worse with walking and reports  decreased range of motion of the knee. He denies any recent trauma, weakness or falls. He has no back pain, numbness or tingling. On exam, patient has spastic lower extremities with limited range of motion of the knee but no tenderness to palpation, crepitus or effusion.  Knee imaging from 2019 showed evidence of medial patellar subluxation.  Patient's knee pain could be likely due to worsening of the patella subluxation versus knee osteoarthritis.  I will prescribe patient Voltaren gel and get repeat knee imaging to further evaluate.  Plan: -Start with Voltaren 1% gel, apply to right knee 4 times daily -Right knee x-ray with AP/sunrise/lateral -Refilled naproxen 375 mg daily -Continue tizanidine 4 mg every 8 hours PRN   See Encounters Tab for problem based charting.  Patient discussed with Dr. Lockie Pares, MD, MPH

## 2023-02-19 ENCOUNTER — Encounter: Payer: Self-pay | Admitting: Student

## 2023-02-19 DIAGNOSIS — M25561 Pain in right knee: Secondary | ICD-10-CM | POA: Insufficient documentation

## 2023-02-19 NOTE — Assessment & Plan Note (Addendum)
Patient with a history of cerebral palsy who currently ambulates with a cane presenting for evaluation of right knee pain. Patient reports he has had right knee pain for the last month described as aching and intermittent in nature. It is worse with walking and reports decreased range of motion of the knee. He denies any recent trauma, weakness or falls. He has no back pain, numbness or tingling. On exam, patient has spastic lower extremities with limited range of motion of the knee but no tenderness to palpation, crepitus or effusion.  Knee imaging from 2019 showed evidence of medial patellar subluxation.  Patient's knee pain could be likely due to worsening of the patella subluxation versus knee osteoarthritis.  I will prescribe patient Voltaren gel and get repeat knee imaging to further evaluate.  Plan: -Start with Voltaren 1% gel, apply to right knee 4 times daily -Right knee x-ray with AP/sunrise/lateral -Refilled naproxen 375 mg daily -Continue tizanidine 4 mg every 8 hours PRN

## 2023-02-19 NOTE — Progress Notes (Signed)
Internal Medicine Clinic Attending  Case discussed with Dr. Amponsah  At the time of the visit.  We reviewed the resident's history and exam and pertinent patient test results.  I agree with the assessment, diagnosis, and plan of care documented in the resident's note.  

## 2023-04-25 ENCOUNTER — Encounter: Payer: Self-pay | Admitting: *Deleted

## 2023-05-17 ENCOUNTER — Encounter: Payer: 59 | Admitting: Student

## 2023-05-25 ENCOUNTER — Encounter: Payer: Self-pay | Admitting: Student

## 2023-05-25 ENCOUNTER — Other Ambulatory Visit: Payer: Self-pay

## 2023-05-25 ENCOUNTER — Ambulatory Visit (INDEPENDENT_AMBULATORY_CARE_PROVIDER_SITE_OTHER): Payer: 59 | Admitting: Student

## 2023-05-25 VITALS — BP 123/73 | HR 73 | Temp 98.3°F | Resp 28 | Ht 68.0 in | Wt 189.0 lb

## 2023-05-25 DIAGNOSIS — G802 Spastic hemiplegic cerebral palsy: Secondary | ICD-10-CM

## 2023-05-25 DIAGNOSIS — M25561 Pain in right knee: Secondary | ICD-10-CM

## 2023-05-25 MED ORDER — TIZANIDINE HCL 4 MG PO TABS: 4.0000 mg | ORAL_TABLET | Freq: Three times a day (TID) | ORAL | 2 refills | Status: DC | PRN

## 2023-05-25 NOTE — Progress Notes (Signed)
This is a Psychologist, occupational Note.  The care of the patient was discussed with Dr. Kirke Corin and the assessment and plan was formulated with their assistance.  Please see their note for official documentation of the patient encounter.   Subjective:   Patient ID: Dustin Mora male   DOB: 01-05-1961 62 y.o.   MRN: [id number]  HPI: Dustin Mora is a 62 y.o. with a PMH of cerebral palsy and HTN who presents today for a follow up of his right knee pain. At his last visit, right knee pain was addressed by prescribing tizanidine 4 mg Q8, naproxen, and voltaren gel. Patient is not currently taking voltaren gel, but is taking naproxen and tizanidine both once a daily has not found relief. He feels the pain is constant and a dull throbbing sensation deep in his knee; it does not limit his daily activities, and he ambulates with a cane and rollator. He has noticed increased muscle stiffness. He denies recent knee trauma, weakness, falls, backpain, numbness, tingling. He describes initial onset of right knee pain as 6 months-1 year ago. He describes a fall 3 years ago, but does not feel that this is associated with his right leg since his left leg was the one impacted. He notices that when he sits for an extended period of time, his right knee begins to hurt more and he stretches it to relieve the pain.    He is currently taking omeprazole, naproxen, tizanidine 4 mg once a day, lisinopril, hydrochlorothiazide and tolerating these medications.   For the details of today's visit, please refer to the assessment and plan.     Current Outpatient Medications  Medication Sig Dispense Refill   cetirizine (ZYRTEC ALLERGY) 10 MG tablet Take 1 tablet (10 mg total) by mouth daily. 90 tablet 2   Cholecalciferol (VITAMIN D3 PO) Take by mouth daily.     diclofenac Sodium (VOLTAREN) 1 % GEL Apply 2 g topically 4 (four) times daily. 150 g 2   fluticasone (FLONASE) 50 MCG/ACT nasal spray Place 2 sprays into both  nostrils daily. 16 g 0   hydrochlorothiazide (HYDRODIURIL) 25 MG tablet Take 1 tablet by mouth once daily 90 tablet 3   lisinopril (ZESTRIL) 20 MG tablet Take 1 tablet by mouth once daily 90 tablet 3   naproxen (NAPROSYN) 375 MG tablet Take 1 tablet (375 mg total) by mouth daily as needed. 90 tablet 1   omeprazole (PRILOSEC) 40 MG capsule Take 1 capsule by mouth once daily 90 capsule 1   tiZANidine (ZANAFLEX) 4 MG tablet Take 1 tablet (4 mg total) by mouth every 8 (eight) hours as needed for up to 270 doses. 90 tablet 2   No current facility-administered medications for this visit.   Review of Systems: Pertinent items are noted in HPI. Objective:  Physical Exam: Vitals:   05/25/23 1519  BP: 123/73  Pulse: 73  Resp: (!) 28  Temp: 98.3 F (36.8 C)  TempSrc: Oral  SpO2: 97%  Weight: 189 lb (85.7 kg)  Height: 5\' 8"  (1.727 m)    Constitutional: NAD, appears comfortable Cardiovascular: RRR, no murmurs auscultated over pulmonic or aortic posts.  Pulmonary/Chest: CTAB, normal breathe sounds.  Knee: No knee asymmetry when comparing right and left knee. No visible knee swelling on either knee. No joint line tenderness of either knee. No right knee tenderness to palpation of the patella.  Extremities: Decreased ROM in both legs. 4/5 strength in both legs.  Psychiatric: Normal mood and affect  Assessment & Plan:   Right knee pain Right knee x ray was negative for right knee subluxation or OA. Patient taking naproxen once a day and tizanidine 4 mg once a day with no relief of sxs. Given lack of relief of sxs and increased muscle stiffness, will refer to physical therapy and recommend patient take tizanidine 4 MG Q8.   Cerebral palsy complicated by spastic diplegia Patient notices increased muscle stiffness that makes it difficult to do some activities like getting into his car. He is able to ambulate and is functional with cane and rollator. Is taking tizanidine 4 mg once a day with no  relief of symptoms.   Will refer to physical therapy and recommend taking tizanidine 4 mg Q8.

## 2023-05-25 NOTE — Assessment & Plan Note (Signed)
Patient notices increased muscle stiffness that makes it difficult to do some activities like getting into his car. He is able to ambulate and is functional with cane and rollator. Is taking tizanidine 4 mg once a day with no relief of symptoms.   Will refer to physical therapy and recommend taking tizanidine 4 mg Q8.

## 2023-05-25 NOTE — Assessment & Plan Note (Signed)
Right knee x ray was negative for right knee subluxation or OA. Patient taking naproxen once a day and tizanidine 4 mg once a day with no relief of sxs. Given lack of relief of sxs and increased muscle stiffness, will refer to physical therapy and recommend patient take tizanidine 4 MG Q8.

## 2023-05-25 NOTE — Patient Instructions (Signed)
Thank you, [name] for allowing [place] to provide your care today. Today we discussed your knee pain and muscle stiffness. I want you to take your tizanidine as prescribed and try physical therapy to help address your symptoms.   I have place a referrals to Physical Therapy  My Chart Access: [email or web address]  Please follow-up in 3 months  Please make sure to arrive 15 minutes prior to your next appointment. If you arrive late, you may be asked to reschedule.    We look forward to seeing you next time. Please call our clinic at [phone] if you have any questions or concerns. The best time to call is Monday-Friday from 9am-4pm, but there is someone available 24/7. If after hours or the weekend, call the main hospital number and ask for the Internal Medicine Resident On-Call. If you need medication refills, please notify your pharmacy one week in advance and they will send [place] a request.   Thank you for letting us take part in your care. Wishing you the best!  Steffanie Rainwater, MD 05/25/2023, 4:44 PM IM Resident, PGY-3 [name] 41:10

## 2023-06-03 NOTE — Progress Notes (Signed)
Internal Medicine Clinic Attending  Case discussed with Dr. Amponsah  at the time of the visit.  We reviewed the resident's history and exam and pertinent patient test results.  I agree with the assessment, diagnosis, and plan of care documented in the resident's note.  

## 2023-08-16 ENCOUNTER — Other Ambulatory Visit: Payer: Self-pay | Admitting: Student

## 2023-11-03 ENCOUNTER — Other Ambulatory Visit: Payer: Self-pay

## 2023-11-03 DIAGNOSIS — G801 Spastic diplegic cerebral palsy: Secondary | ICD-10-CM

## 2023-11-04 MED ORDER — NAPROXEN 375 MG PO TABS: 375.0000 mg | ORAL_TABLET | Freq: Every day | ORAL | 1 refills | Status: DC | PRN

## 2023-11-10 ENCOUNTER — Other Ambulatory Visit: Payer: Self-pay | Admitting: Internal Medicine

## 2023-11-10 NOTE — Telephone Encounter (Signed)
Pt was called to schedule his next appt. LOV 05/2023 with 3 month f/u. Call transferred to front office- appt schedule w/Dr Ninfa Meeker tomorrow 1212/24.

## 2023-11-11 ENCOUNTER — Encounter: Payer: Self-pay | Admitting: Student

## 2023-11-11 ENCOUNTER — Ambulatory Visit (INDEPENDENT_AMBULATORY_CARE_PROVIDER_SITE_OTHER): Payer: 59 | Admitting: Student

## 2023-11-11 VITALS — BP 121/74 | HR 75 | Temp 98.2°F | Ht 68.0 in | Wt 190.0 lb

## 2023-11-11 DIAGNOSIS — G4700 Insomnia, unspecified: Secondary | ICD-10-CM | POA: Diagnosis not present

## 2023-11-11 DIAGNOSIS — E785 Hyperlipidemia, unspecified: Secondary | ICD-10-CM | POA: Diagnosis not present

## 2023-11-11 DIAGNOSIS — F17210 Nicotine dependence, cigarettes, uncomplicated: Secondary | ICD-10-CM

## 2023-11-11 DIAGNOSIS — F17200 Nicotine dependence, unspecified, uncomplicated: Secondary | ICD-10-CM

## 2023-11-11 DIAGNOSIS — E782 Mixed hyperlipidemia: Secondary | ICD-10-CM

## 2023-11-11 DIAGNOSIS — Z791 Long term (current) use of non-steroidal anti-inflammatories (NSAID): Secondary | ICD-10-CM

## 2023-11-11 DIAGNOSIS — I10 Essential (primary) hypertension: Secondary | ICD-10-CM

## 2023-11-11 MED ORDER — TRAZODONE HCL 50 MG PO TABS: 50.0000 mg | ORAL_TABLET | Freq: Every day | ORAL | 3 refills | Status: AC

## 2023-11-11 NOTE — Patient Instructions (Addendum)
Every month, please take a one week break from naproxen. Use tylenol instead. Take up to 4000mg  per day. I would suggest between 500mg -1000mg  every six hours as needed.  For sleep, I will start trazodone. Take it about one hour before bed.  Please return in 6 months.

## 2023-11-11 NOTE — Assessment & Plan Note (Signed)
Smokes about 5 cigarettes/day.  Precontemplative stage.  Does not want medicines to help.  Rec quitting.

## 2023-11-11 NOTE — Assessment & Plan Note (Signed)
Lipid panel in 2021 was elevated.  At this point I do not believe he is on a statin.  I will check lipids and start a statin if necessary.

## 2023-11-11 NOTE — Progress Notes (Signed)
   CC: Check up & insomnia  HPI:  [name] is a 62 y.o. male living with a history stated below and presents today for checkup. Please see problem based assessment and plan for additional details.  Past Medical History:  Diagnosis Date   Arthritis    Cerebral palsy (HCC)    GERD (gastroesophageal reflux disease)    Hammer toe    right   Hearing loss of both ears    Pt has bilateral hearing aids   Hypertension    Swollen L knee     Review of Systems: ROS negative except for what is noted on the assessment and plan.  Vitals:   11/11/23 1529  BP: 121/74  Pulse: 75  Temp: 98.2 F (36.8 C)  TempSrc: Oral  Weight: 190 lb (86.2 kg)  Height: 5\' 8"  (1.727 m)    Physical Exam: Constitutional: well-appearing man sitting in wheelchair, in no acute distress HENT: normocephalic atraumatic, mucous membranes moist Eyes: conjunctiva non-erythematous Cardiovascular: regular rate and rhythm, no m/r/g Pulmonary/Chest: normal work of breathing on room air, lungs clear to auscultation bilaterally MSK: normal bulk and tone Neurological: alert & oriented x 3, no focal deficit Skin: warm and dry Psych: normal mood and behavior  Assessment & Plan:   Patient seen with Dr. Sol Blazing  HTN (hypertension) Blood pressure is under good control, 121/74. - Continue hydrochlorothiazide 25 daily and lisinopril 20 daily  Hyperlipidemia Lipid panel in 2021 was elevated.  At this point I do not believe he is on a statin.  I will check lipids and start a statin if necessary.  Insomnia Mixed insomnia that is now an ongoing issue.  He was evaluated for this last year.  At that point he had a discussion about sleep hygiene and ramelteon, but it did not solve his problem.  We discussed sleep hygiene again today.  He would like to try medicine. - Trazodone 50 nightly  NSAID long-term use He is taking naproxen daily and has for many years in the setting of chronic right knee pain.  He attributes  his knee pain to his cerebral palsy.  He has had imaging in the past which was negative, physical therapy was not very helpful.  Previous providers have attempted to dissuade him from regular use, his prescribed dose was recently decreased, but he does continue to prefer on taking it daily.  Today we talked about at least taking a break from this medicine for 1 week out of every month and using Tylenol instead during that time.  He was receptive to this and said he will try.  No reported signs of GI bleeding. - BMP today  Tobacco use disorder Smokes about 5 cigarettes/day.  Precontemplative stage.  Does not want medicines to help.  Rec quitting.  Katheran James, D.O. [facility] Internal Medicine, PGY-1 Phone: [phone] Date 11/11/2023 Time 9:15 PM

## 2023-11-11 NOTE — Assessment & Plan Note (Signed)
Mixed insomnia that is now an ongoing issue.  He was evaluated for this last year.  At that point he had a discussion about sleep hygiene and ramelteon, but it did not solve his problem.  We discussed sleep hygiene again today.  He would like to try medicine. - Trazodone 50 nightly

## 2023-11-11 NOTE — Assessment & Plan Note (Signed)
He is taking naproxen daily and has for many years in the setting of chronic right knee pain.  He attributes his knee pain to his cerebral palsy.  He has had imaging in the past which was negative, physical therapy was not very helpful.  Previous providers have attempted to dissuade him from regular use, his prescribed dose was recently decreased, but he does continue to prefer on taking it daily.  Today we talked about at least taking a break from this medicine for 1 week out of every month and using Tylenol instead during that time.  He was receptive to this and said he will try.  No reported signs of GI bleeding. - BMP today

## 2023-11-11 NOTE — Assessment & Plan Note (Signed)
Blood pressure is under good control, 121/74. - Continue hydrochlorothiazide 25 daily and lisinopril 20 daily

## 2023-11-13 LAB — BMP8+ANION GAP
Anion Gap: 15 mmol/L (ref 10.0–18.0)
BUN/Creatinine Ratio: 13 (ref 10–24)
BUN: 15 mg/dL (ref 8–27)
CO2: 22 mmol/L (ref 20–29)
Calcium: 9.2 mg/dL (ref 8.6–10.2)
Chloride: 103 mmol/L (ref 96–106)
Creatinine, Ser: 1.16 mg/dL (ref 0.76–1.27)
Glucose: 100 mg/dL — ABNORMAL HIGH (ref 70–99)
Potassium: 4 mmol/L (ref 3.5–5.2)
Sodium: 140 mmol/L (ref 134–144)
eGFR: 71 mL/min/{1.73_m2} (ref >59)

## 2023-11-13 LAB — LIPID PANEL
Chol/HDL Ratio: 4.1 {ratio} (ref 0.0–5.0)
Cholesterol, Total: 200 mg/dL — ABNORMAL HIGH (ref 100–199)
HDL: 49 mg/dL (ref >39)
LDL Chol Calc (NIH): 131 mg/dL — ABNORMAL HIGH (ref 0–99)
Triglycerides: 110 mg/dL (ref 0–149)
VLDL Cholesterol Cal: 20 mg/dL (ref 5–40)

## 2023-11-15 MED ORDER — ATORVASTATIN CALCIUM 40 MG PO TABS: 40.0000 mg | ORAL_TABLET | Freq: Every day | ORAL | 11 refills | Status: DC

## 2023-11-15 NOTE — Progress Notes (Signed)
Internal Medicine Clinic Attending  I was physically present during the key portions of the resident provided service and participated in the medical decision making of patient's management care. I reviewed pertinent patient test results.  The assessment, diagnosis, and plan were formulated together and I agree with the documentation in the resident's note.  Lau, Grace, MD  

## 2023-11-15 NOTE — Addendum Note (Signed)
Addended by: Katheran James on: 11/15/2023 01:48 PM   Modules accepted: Orders

## 2023-11-15 NOTE — Addendum Note (Signed)
Addended by: Katheran James on: 11/15/2023 01:28 PM   Modules accepted: Orders

## 2023-11-17 ENCOUNTER — Ambulatory Visit: Payer: 59

## 2023-11-17 VITALS — Ht 68.0 in | Wt 190.0 lb

## 2023-11-17 DIAGNOSIS — Z0000 Encounter for general adult medical examination without abnormal findings: Secondary | ICD-10-CM | POA: Diagnosis not present

## 2023-11-17 NOTE — Patient Instructions (Signed)
Dustin Mora , Thank you for taking time to come for your Medicare Wellness Visit. I appreciate your ongoing commitment to your health goals. Please review the following plan we discussed and let me know if I can assist you in the future.   Referrals/Orders/Follow-Ups/Clinician Recommendations: Remember to call office to schedule a follow up visit and also to keep up with any new symptoms while taking your new medication.  It was nice talking to you today and hope you have a Merry Christmas.   This is a list of the screening recommended for you and due dates:  Health Maintenance  Topic Date Due   Flu Shot  02/28/2024*   Medicare Annual Wellness Visit  11/16/2024   Colon Cancer Screening  01/07/2029   DTaP/Tdap/Td vaccine (2 - Td or Tdap) 03/12/2032   COVID-19 Vaccine  Completed   Hepatitis C Screening  Completed   HIV Screening  Completed   Zoster (Shingles) Vaccine  Completed   HPV Vaccine  Aged Out  *Topic was postponed. The date shown is not the original due date.    Advanced directives: (Declined) Advance directive discussed with you today. Even though you declined this today, please call our office should you change your mind, and we can give you the proper paperwork for you to fill out.  Next Medicare Annual Wellness Visit scheduled for next year: Yes

## 2023-11-17 NOTE — Progress Notes (Signed)
Subjective:   Dustin Mora is a 62 y.o. male who presents for Medicare Annual/Subsequent preventive examination.  Visit Complete: Virtual I connected with  TRUSTEN NORMOYLE on 11/17/23 by a audio enabled telemedicine application and verified that I am speaking with the correct person using two identifiers.  Patient Location: Home  Provider Location: Home Office  I discussed the limitations of evaluation and management by telemedicine. The patient expressed understanding and agreed to proceed.  Vital Signs: Because this visit was a virtual/telehealth visit, some criteria may be missing or patient reported. Any vitals not documented were not able to be obtained and vitals that have been documented are patient reported.   Cardiac Risk Factors include: advanced age (>85men, >48 women);male gender;hypertension;Other (see comment), Risk factor comments: Cerebral palsy     Objective:    Today's Vitals   11/17/23 0901  Weight: 190 lb (86.2 kg)  Height: 5\' 8"  (1.727 m)   Body mass index is 28.89 kg/m.     11/17/2023    9:11 AM 05/25/2023    3:17 PM 02/18/2023    3:17 PM 10/08/2022    2:39 PM 10/08/2022    1:58 PM 03/03/2022    2:39 PM 11/12/2021    9:56 AM  Advanced Directives  Does Patient Have a Medical Advance Directive? No No No No No No No  Would patient like information on creating a medical advance directive?  No - Patient declined No - Patient declined No - Patient declined No - Patient declined No - Patient declined No - Patient declined    Current Medications (verified) Outpatient Encounter Medications as of 11/17/2023  Medication Sig   atorvastatin (LIPITOR) 40 MG tablet Take 1 tablet (40 mg total) by mouth daily.   Cholecalciferol (VITAMIN D3 PO) Take by mouth daily.   diclofenac Sodium (VOLTAREN) 1 % GEL Apply 2 g topically 4 (four) times daily.   fluticasone (FLONASE) 50 MCG/ACT nasal spray Place 2 sprays into both nostrils daily.   hydrochlorothiazide  (HYDRODIURIL) 25 MG tablet Take 1 tablet by mouth once daily   lisinopril (ZESTRIL) 20 MG tablet Take 1 tablet by mouth once daily   naproxen (NAPROSYN) 375 MG tablet Take 1 tablet (375 mg total) by mouth daily as needed.   omeprazole (PRILOSEC) 40 MG capsule Take 1 capsule by mouth once daily   tiZANidine (ZANAFLEX) 4 MG tablet Take 1 tablet (4 mg total) by mouth every 8 (eight) hours as needed for up to 270 doses.   traZODone (DESYREL) 50 MG tablet Take 1 tablet (50 mg total) by mouth at bedtime.   cetirizine (ZYRTEC ALLERGY) 10 MG tablet Take 1 tablet (10 mg total) by mouth daily.   No facility-administered encounter medications on file as of 11/17/2023.    Allergies (verified) Patient has no known allergies.   History: Past Medical History:  Diagnosis Date   Arthritis    Cerebral palsy (HCC)    GERD (gastroesophageal reflux disease)    Hammer toe    right   Hearing loss of both ears    Pt has bilateral hearing aids   Hypertension    Swollen L knee    Past Surgical History:  Procedure Laterality Date   COLONOSCOPY  05/01/2011   diverticulosis.  Brodie.   FOOT SURGERY     due to CP   Family History  Problem Relation Age of Onset   Prostate cancer Father    Colon polyps Neg Hx    Colon cancer Neg  Hx    Social History   Socioeconomic History   Marital status: Single    Spouse name: Not on file   Number of children: 0   Years of education: Not on file   Highest education level: Not on file  Occupational History   Occupation: Disabled  Tobacco Use   Smoking status: Every Day    Current packs/day: 0.30    Average packs/day: 0.3 packs/day for 15.0 years (4.5 ttl pk-yrs)    Types: Cigarettes   Smokeless tobacco: Never   Tobacco comments:    Smoking 6 cigs per day  Vaping Use   Vaping status: Never Used  Substance and Sexual Activity   Alcohol use: Yes    Alcohol/week: 3.0 standard drinks of alcohol    Types: 3 Standard drinks or equivalent per week    Comment:  occasionally   Drug use: No   Sexual activity: Not Currently  Other Topics Concern   Not on file  Social History Narrative   Lives alone   Social Drivers of Health   Financial Resource Strain: Medium Risk (11/17/2023)   Overall Financial Resource Strain (CARDIA)    Difficulty of Paying Living Expenses: Somewhat hard  Food Insecurity: No Food Insecurity (11/17/2023)   Hunger Vital Sign    Worried About Running Out of Food in the Last Year: Never true    Ran Out of Food in the Last Year: Never true  Transportation Needs: No Transportation Needs (11/17/2023)   PRAPARE - Administrator, Civil Service (Medical): No    Lack of Transportation (Non-Medical): No  Physical Activity: Inactive (11/17/2023)   Exercise Vital Sign    Days of Exercise per Week: 0 days    Minutes of Exercise per Session: 0 min  Stress: No Stress Concern Present (11/17/2023)   [employer] of Occupational Health - Occupational Stress Questionnaire    Feeling of Stress : Not at all  Social Connections: Moderately Isolated (11/17/2023)   Social Connection and Isolation Panel [NHANES]    Frequency of Communication with Friends and Family: More than three times a week    Frequency of Social Gatherings with Friends and Family: Once a week    Attends Religious Services: 1 to 4 times per year    Active Member of [employer] or Organizations: No    Attends Engineer, structural: Never    Marital Status: Never married    Tobacco Counseling Ready to quit: Not Answered Counseling given: Not Answered Tobacco comments: Smoking 6 cigs per day   Clinical Intake:  Pre-visit preparation completed: Yes  Pain : No/denies pain Pain Score:  (felt some crapping in lt leg this morning)     BMI - recorded: 28.89 Nutritional Status: BMI 25 -29 Overweight Nutritional Risks: None Diabetes: No  How often do you need to have someone help you when you read instructions, pamphlets, or other written  materials from your doctor or pharmacy?: 1 - Never  Interpreter Needed?: No  Information entered by :: Lexie Morini, RMA   Activities of Daily Living    11/17/2023    9:06 AM 05/25/2023    3:18 PM  In your present state of health, do you have any difficulty performing the following activities:  Hearing? 1 0  Comment Wears hearing aides   Vision? 0 0  Difficulty concentrating or making decisions? 0 0  Walking or climbing stairs? 1 1  Comment  pain in right  knee  Dressing or bathing? 0 0  Doing errands, shopping? 1 1  Comment uses uber, lyft or [employer]   Preparing Food and eating ? N   Using the Toilet? N   In the past six months, have you accidently leaked urine? N   Do you have problems with loss of bowel control? N   Managing your Medications? N   Managing your Finances? N   Housekeeping or managing your Housekeeping? N     Patient Care Team: Monna Fam, MD as PCP - General Barron Alvine, MD (Inactive) as Consulting Physician (Urology)  Indicate any recent Medical Services you may have received from other than [facility] providers in the past year (date may be approximate).     Assessment:   This is a routine wellness examination for Edwen.  Hearing/Vision screen Hearing Screening - Comments:: Wears hearing aides Vision Screening - Comments:: Wears eyeglasses   Goals Addressed               This Visit's Progress     Patient Stated (pt-stated)        Not at the moment      Depression Screen    11/17/2023    9:14 AM 05/25/2023    3:18 PM 02/18/2023    4:20 PM 10/08/2022    2:38 PM 10/08/2022    1:57 PM 10/08/2022    1:53 PM 11/12/2021    9:54 AM  PHQ 2/9 Scores  PHQ - 2 Score 0 0 0 0 0 0 0  PHQ- 9 Score 3  1    0    Fall Risk    11/17/2023    9:12 AM 05/25/2023    3:18 PM 02/18/2023    3:15 PM 10/08/2022    2:39 PM 10/08/2022    1:57 PM  Fall Risk   Falls in the past year? 0 0 0 0 0  Number falls in past yr: 0 0 0 0 0  Injury with Fall? 0 0 0 0 0   Risk for fall due to : No Fall Risks No Fall Risks History of fall(s);Impaired mobility;Impaired balance/gait;Impaired vision Impaired balance/gait Impaired balance/gait  Follow up Falls prevention discussed;Falls evaluation completed Falls evaluation completed;Falls prevention discussed Falls evaluation completed Falls evaluation completed;Falls prevention discussed Falls evaluation completed;Falls prevention discussed    MEDICARE RISK AT HOME: Medicare Risk at Home Any stairs in or around the home?: No Home free of loose throw rugs in walkways, pet beds, electrical cords, etc?: Yes Adequate lighting in your home to reduce risk of falls?: Yes Life alert?: No Use of a cane, walker or w/c?: Yes Grab bars in the bathroom?: Yes Shower chair or bench in shower?: Yes Elevated toilet seat or a handicapped toilet?: Yes  TIMED UP AND GO:  Was the test performed?  No    Cognitive Function:        11/17/2023    9:33 AM 10/08/2022    2:40 PM  6CIT Screen  What Year? 0 points 0 points  What month? 0 points 0 points  What time? 0 points 0 points  Count back from 20 0 points 0 points  Months in reverse 0 points 0 points  Repeat phrase 0 points 0 points  Total Score 0 points 0 points    Immunizations Immunization History  Administered Date(s) Administered   Influenza,inj,Quad PF,6+ Mos 09/21/2018, 09/05/2020   Influenza-Unspecified 07/31/2013, 08/20/2022   PFIZER(Purple Top)SARS-COV-2 Vaccination 03/14/2020, 04/09/2020, 11/26/2020   Pfizer Covid-19 Vaccine Bivalent Booster 57yrs & up 11/07/2021   Pfizer(Comirnaty)Fall Seasonal  Vaccine 12 years and older 12/25/2022, 09/17/2023   Tdap 03/12/2022   Zoster Recombinant(Shingrix) 11/07/2021, 03/12/2022    TDAP status: Up to date  Flu Vaccine status: Due, Education has been provided regarding the importance of this vaccine. Advised may receive this vaccine at local pharmacy or Health Dept. Aware to provide a copy of the vaccination  record if obtained from local pharmacy or Health Dept. Verbalized acceptance and understanding.  Pneumococcal vaccine status: Due, Education has been provided regarding the importance of this vaccine. Advised may receive this vaccine at local pharmacy or Health Dept. Aware to provide a copy of the vaccination record if obtained from local pharmacy or Health Dept. Verbalized acceptance and understanding.  Covid-19 vaccine status: Completed vaccines  Qualifies for Shingles Vaccine? Yes   Zostavax completed Yes   Shingrix Completed?: Yes  Screening Tests Health Maintenance  Topic Date Due   INFLUENZA VACCINE  02/28/2024 (Originally 07/01/2023)   Medicare Annual Wellness (AWV)  11/16/2024   Colonoscopy  01/07/2029   DTaP/Tdap/Td (2 - Td or Tdap) 03/12/2032   COVID-19 Vaccine  Completed   Hepatitis C Screening  Completed   HIV Screening  Completed   Zoster Vaccines- Shingrix  Completed   HPV VACCINES  Aged Out    Health Maintenance  There are no preventive care reminders to display for this patient.   Colorectal cancer screening: Type of screening: Colonoscopy. Completed 01/07/2022. Repeat every 7 years  Lung Cancer Screening: (Low Dose CT Chest recommended if Age 24-80 years, 20 pack-year currently smoking OR have quit w/in 15years.) does qualify.   Lung Cancer Screening Referral: N/A  Additional Screening:  Hepatitis C Screening: does qualify; Completed 09/05/2020  Vision Screening: Recommended annual ophthalmology exams for early detection of glaucoma and other disorders of the eye. Is the patient up to date with their annual eye exam?  No  Who is the provider or what is the name of the office in which the patient attends annual eye exams? [facility] If pt is not established with a provider, would they like to be referred to a provider to establish care? No .   Dental Screening: Recommended annual dental exams for proper oral hygiene   Community Resource Referral / Chronic  Care Management: CRR required this visit?  No   CCM required this visit?  No     Plan:     I have personally reviewed and noted the following in the patient's chart:   Medical and social history Use of alcohol, tobacco or illicit drugs  Current medications and supplements including opioid prescriptions. Patient is not currently taking opioid prescriptions. Functional ability and status Nutritional status Physical activity Advanced directives List of other physicians Hospitalizations, surgeries, and ER visits in previous 12 months Vitals Screenings to include cognitive, depression, and falls Referrals and appointments  In addition, I have reviewed and discussed with patient certain preventive protocols, quality metrics, and best practice recommendations. A written personalized care plan for preventive services as well as general preventive health recommendations were provided to patient.     Jarrin Staley L Tonyia Marschall, CMA   11/17/2023   After Visit Summary: (MyChart) Due to this being a telephonic visit, the after visit summary with patients personalized plan was offered to patient via MyChart   Nurse Notes: Patient stated that he has had his Flu vaccine recently, however it is not recorded in [facility] as of yet.  Patient is up to date with all other health maintenance.  He mentioned that he had some  leg cramping last night and that he just started taking new medication Lipitor 40 mg at night.  I informed patient to keep documentation of leg cramps and any other changes while taking new medication and report back during his next office visit.

## 2023-12-23 ENCOUNTER — Other Ambulatory Visit: Payer: Self-pay | Admitting: Internal Medicine

## 2023-12-23 ENCOUNTER — Telehealth: Payer: Self-pay

## 2023-12-23 MED ORDER — ATORVASTATIN CALCIUM 20 MG PO TABS: 40.0000 mg | ORAL_TABLET | Freq: Every day | ORAL | 11 refills | Status: DC

## 2023-12-23 NOTE — Telephone Encounter (Signed)
Refill request from the pharmacy for atorvastatin, per pharmacy patient is requesting 20 mg tablets every day he is having a hard time with the 40 mg tablets since they are larger.  [facility] - [place] ([place]), [place] - [address]

## 2024-02-07 ENCOUNTER — Other Ambulatory Visit: Payer: Self-pay

## 2024-02-07 MED ORDER — HYDROCHLOROTHIAZIDE 25 MG PO TABS: 25.0000 mg | ORAL_TABLET | Freq: Every day | ORAL | 3 refills | Status: DC

## 2024-02-07 NOTE — Telephone Encounter (Signed)
 Medication sent to pharmacy

## 2024-02-09 ENCOUNTER — Other Ambulatory Visit: Payer: Self-pay

## 2024-02-09 MED ORDER — LISINOPRIL 20 MG PO TABS: 20.0000 mg | ORAL_TABLET | Freq: Every day | ORAL | 3 refills | Status: AC

## 2024-02-09 NOTE — Telephone Encounter (Signed)
 Medication sent to pharmacy

## 2024-02-10 ENCOUNTER — Other Ambulatory Visit: Payer: Self-pay | Admitting: Internal Medicine

## 2024-02-11 ENCOUNTER — Other Ambulatory Visit: Payer: Self-pay | Admitting: Urology

## 2024-02-11 DIAGNOSIS — R972 Elevated prostate specific antigen [PSA]: Secondary | ICD-10-CM

## 2024-02-11 DIAGNOSIS — N4232 Atypical small acinar proliferation of prostate: Secondary | ICD-10-CM

## 2024-03-18 ENCOUNTER — Ambulatory Visit
Admission: RE | Admit: 2024-03-18 | Discharge: 2024-03-18 | Disposition: A | Discharge status: Home / Self Care | Source: Ambulatory Visit | Attending: Urology | Admitting: Urology

## 2024-03-18 DIAGNOSIS — N4232 Atypical small acinar proliferation of prostate: Secondary | ICD-10-CM

## 2024-03-18 DIAGNOSIS — R972 Elevated prostate specific antigen [PSA]: Secondary | ICD-10-CM

## 2024-03-18 MED ORDER — GADOPICLENOL 0.5 MMOL/ML IV SOLN
9.0000 mL | Freq: Once | INTRAVENOUS | Status: AC | PRN
  Administered 2024-03-18: 9 mL via INTRAVENOUS

## 2024-04-05 ENCOUNTER — Other Ambulatory Visit: Payer: Self-pay

## 2024-04-05 DIAGNOSIS — G802 Spastic hemiplegic cerebral palsy: Secondary | ICD-10-CM

## 2024-04-07 MED ORDER — TIZANIDINE HCL 4 MG PO TABS: 4.0000 mg | ORAL_TABLET | Freq: Three times a day (TID) | ORAL | 2 refills | Status: AC | PRN

## 2024-04-18 ENCOUNTER — Ambulatory Visit: Payer: Self-pay | Admitting: Podiatry

## 2024-04-18 ENCOUNTER — Encounter: Payer: Self-pay | Admitting: Podiatry

## 2024-04-18 ENCOUNTER — Ambulatory Visit (INDEPENDENT_AMBULATORY_CARE_PROVIDER_SITE_OTHER): Payer: Self-pay | Admitting: Podiatry

## 2024-04-18 DIAGNOSIS — M79672 Pain in left foot: Secondary | ICD-10-CM

## 2024-04-18 DIAGNOSIS — B351 Tinea unguium: Secondary | ICD-10-CM

## 2024-04-18 DIAGNOSIS — M79671 Pain in right foot: Secondary | ICD-10-CM | POA: Diagnosis not present

## 2024-04-18 NOTE — Progress Notes (Signed)
 Patient presents for evaluation and treatment of tenderness and some redness around nails feet.  Tenderness around toes with walking and wearing shoes.  Physical exam:  General appearance: Alert, pleasant, and in no acute distress.  Vascular: Pedal pulses: DP palpable bilaterally, PT nonpalpable bilaterally.  Ader at edema lower legs bilaterally  Neurological:  No paresthesias or burning noted  Dermatologic:  Nails thickened, disfigured, discolored 1-5 BL with subungual debris.  Redness and hypertrophic nail folds along nail folds bilaterally but no signs of drainage or infection.  Musculoskeletal:  Pes planus bilaterally   Diagnosis: 1. Painful onychomycotic nails 1 through 5 bilaterally. 2. Pain toes 1 through 5 bilaterally.  Plan: Debrided onychomycotic nails 1 through 5 bilaterally.  Return 3 months

## 2024-05-04 ENCOUNTER — Other Ambulatory Visit: Payer: Self-pay | Admitting: Internal Medicine

## 2024-05-05 ENCOUNTER — Other Ambulatory Visit: Payer: Self-pay | Admitting: Internal Medicine

## 2024-05-05 DIAGNOSIS — G801 Spastic diplegic cerebral palsy: Secondary | ICD-10-CM

## 2024-07-20 ENCOUNTER — Ambulatory Visit (INDEPENDENT_AMBULATORY_CARE_PROVIDER_SITE_OTHER): Admitting: Podiatry

## 2024-07-20 ENCOUNTER — Encounter: Payer: Self-pay | Admitting: Podiatry

## 2024-07-20 DIAGNOSIS — B351 Tinea unguium: Secondary | ICD-10-CM

## 2024-07-20 DIAGNOSIS — M79671 Pain in right foot: Secondary | ICD-10-CM

## 2024-07-20 DIAGNOSIS — M79672 Pain in left foot: Secondary | ICD-10-CM

## 2024-07-20 NOTE — Progress Notes (Signed)
 Patient presents for evaluation and treatment of tenderness and some redness around nails feet.  Tenderness around toes with walking and wearing shoes.  Physical exam:  General appearance: Alert, pleasant, and in no acute distress.  Vascular: Pedal pulses: DP 2/4 B/L, PT 0/4 B/L. Moderate edema lower legs bilaterally  Neurological:    Dermatologic:  Nails thickened, disfigured, discolored 1-5 BL with subungual debris.  Redness and hypertrophic nail folds along nail folds bilaterally but no signs of drainage or infection.  Musculoskeletal:     Diagnosis: 1. Painful onychomycotic nails 1 through 5 bilaterally. 2. Pain toes 1 through 5 bilaterally.  Plan: Debrided onychomycotic nails 1 through 5 bilaterally.  Sharply debrided nails with a tissue nipper and reduced with a power bur.  Return 3 months RFC

## 2024-08-01 ENCOUNTER — Other Ambulatory Visit: Payer: Self-pay

## 2024-08-01 MED ORDER — OMEPRAZOLE 40 MG PO CPDR: 40.0000 mg | DELAYED_RELEASE_CAPSULE | Freq: Every day | ORAL | 0 refills | Status: DC

## 2024-08-01 NOTE — Telephone Encounter (Signed)
 Medication sent to pharmacy

## 2024-08-04 ENCOUNTER — Other Ambulatory Visit: Payer: Self-pay

## 2024-08-04 DIAGNOSIS — G801 Spastic diplegic cerebral palsy: Secondary | ICD-10-CM

## 2024-08-05 MED ORDER — NAPROXEN 375 MG PO TABS: 375.0000 mg | ORAL_TABLET | Freq: Every day | ORAL | 0 refills | Status: AC | PRN

## 2024-08-09 ENCOUNTER — Encounter: Payer: Self-pay | Admitting: Student

## 2024-08-09 ENCOUNTER — Other Ambulatory Visit: Payer: Self-pay

## 2024-08-09 ENCOUNTER — Ambulatory Visit: Admitting: Student

## 2024-08-09 VITALS — BP 100/55 | HR 79 | Temp 98.2°F | Ht 68.0 in | Wt 183.4 lb

## 2024-08-09 DIAGNOSIS — Z23 Encounter for immunization: Secondary | ICD-10-CM | POA: Diagnosis not present

## 2024-08-09 DIAGNOSIS — I10 Essential (primary) hypertension: Secondary | ICD-10-CM

## 2024-08-09 DIAGNOSIS — E785 Hyperlipidemia, unspecified: Secondary | ICD-10-CM | POA: Diagnosis not present

## 2024-08-09 DIAGNOSIS — F17210 Nicotine dependence, cigarettes, uncomplicated: Secondary | ICD-10-CM | POA: Diagnosis not present

## 2024-08-09 DIAGNOSIS — Z79899 Other long term (current) drug therapy: Secondary | ICD-10-CM

## 2024-08-09 DIAGNOSIS — Z131 Encounter for screening for diabetes mellitus: Secondary | ICD-10-CM | POA: Diagnosis not present

## 2024-08-09 DIAGNOSIS — G801 Spastic diplegic cerebral palsy: Secondary | ICD-10-CM | POA: Diagnosis not present

## 2024-08-09 DIAGNOSIS — F17200 Nicotine dependence, unspecified, uncomplicated: Secondary | ICD-10-CM

## 2024-08-09 NOTE — Assessment & Plan Note (Signed)
 1/3 pack/day x 30+ years.  Approximately 15-pack-year history.  Precontemplative.  Not ready to stop, does not want medicines.  Does not qualify for low-dose CT scans.  Monitor, stopping would be best for his health.

## 2024-08-09 NOTE — Progress Notes (Signed)
   CC: General checkup, muscle tightness  HPI:  Dustin Mora is a 63 y.o. male with a PMH stated below who presents today for checkup.  Please see problem based assessment and plan for additional details.  Past Medical History:  Diagnosis Date   Arthritis    Cerebral palsy (HCC)    GERD (gastroesophageal reflux disease)    Hammer toe    right   Hearing loss of both ears    Pt has bilateral hearing aids   Hypertension    Swollen L knee     Review of Systems: ROS negative except for what is noted on the assessment and plan.  Vitals:   08/09/24 0954 08/09/24 0958  BP: (!) 103/48 (!) 100/55  Pulse: 82 79  Temp: 98.2 F (36.8 C)   TempSrc: Oral   SpO2: 97%   Weight: 183 lb 6.4 oz (83.2 kg)   Height: 5' 8 (1.727 m)     Physical Exam: Constitutional: well-appearing and in no acute distress.  Uses a walker. Cardiovascular: regular rate and rhythm, no m/r/g Pulmonary/Chest: normal work of breathing on room air, lungs clear to auscultation bilaterally Abdominal: soft, non-tender, non-distended MSK: normal bulk and tone in upper extremities, reduced bulk in lower extremities.  Contractures of both legs without acute injury or swelling. Skin: warm and dry Psych: normal mood and behavior  Assessment & Plan:   Patient discussed with Dr. Francesco  HTN (hypertension) Blood pressure is low.  100/35 confirmed on repeat.  Asymptomatic.  Well-hydrated.  This is too much control. - Stop hydrochlorothiazide  25 - Continue lisinopril  20 - He will measure blood pressure periodically at home.  Asked him to let us  know if it is persistently over 130/80  Cerebral palsy complicated by spastic diplegia Chronic muscle stiffness of the lower extremities related to his several palsy but also aging.  It is becoming more difficult for him to get in and out of cars.  Takes tizanidine  4-8 as needed with some relief.  He also takes naproxen  for this, I cautioned against frequent use. - Will  refer to physical therapy  Hyperlipidemia Lipid panel today.  He is a smoker 1/3 pack/day.  High risk patient.  If changes needed anticipate changing to Crestor.  Primary prevention, but given high risk, would prefer to see LDL at or below 70.  Tobacco use disorder 1/3 pack/day x 30+ years.  Approximately 15-pack-year history.  Precontemplative.  Not ready to stop, does not want medicines.  Does not qualify for low-dose CT scans.  Monitor, stopping would be best for his health.  RTC in 6 months for check up re blood pressure, muscle tightness, smoking  Dustin Mora, D.O. [facility], PGY-2 Phone: [phone] Date 08/09/2024 Time 11:40 AM

## 2024-08-09 NOTE — Assessment & Plan Note (Signed)
 Blood pressure is low.  100/35 confirmed on repeat.  Asymptomatic.  Well-hydrated.  This is too much control. - Stop hydrochlorothiazide  25 - Continue lisinopril  20 - Dustin Mora will measure blood pressure periodically at home.  Asked him to let us  know if it is persistently over 130/80

## 2024-08-09 NOTE — Patient Instructions (Addendum)
 Please stop taking hydrochlorothiazide  (for blood pressure). Check you pressures at home once or twice per week. If consistently above 130/80, please let the clinic know.  Please expect a call to set up physical therapy  I recommend a checkup in 6 months

## 2024-08-09 NOTE — Assessment & Plan Note (Signed)
 Lipid panel today.  He is a smoker 1/3 pack/day.  High risk patient.  If changes needed anticipate changing to Crestor.  Primary prevention, but given high risk, would prefer to see LDL at or below 70.

## 2024-08-09 NOTE — Assessment & Plan Note (Addendum)
 Chronic muscle stiffness of the lower extremities related to his several palsy but also aging.  It is becoming more difficult for him to get in and out of cars.  Takes tizanidine  4-8 as needed with some relief.  He also takes naproxen  for this, I cautioned against frequent use. - Will refer to physical therapy

## 2024-08-10 ENCOUNTER — Ambulatory Visit: Payer: Self-pay | Admitting: Student

## 2024-08-10 LAB — HEMOGLOBIN A1C
Est. average glucose Bld gHb Est-mCnc: 94 mg/dL
Hgb A1c MFr Bld: 4.9 % (ref 4.8–5.6)

## 2024-08-10 LAB — LIPID PANEL
Chol/HDL Ratio: 2.7 ratio (ref 0.0–5.0)
Cholesterol, Total: 141 mg/dL (ref 100–199)
HDL: 53 mg/dL (ref >39)
LDL Chol Calc (NIH): 72 mg/dL (ref 0–99)
Triglycerides: 82 mg/dL (ref 0–149)
VLDL Cholesterol Cal: 16 mg/dL (ref 5–40)

## 2024-08-10 LAB — BASIC METABOLIC PANEL WITH GFR
BUN/Creatinine Ratio: 17 (ref 10–24)
BUN: 20 mg/dL (ref 8–27)
CO2: 23 mmol/L (ref 20–29)
Calcium: 9.9 mg/dL (ref 8.6–10.2)
Chloride: 101 mmol/L (ref 96–106)
Creatinine, Ser: 1.19 mg/dL (ref 0.76–1.27)
Glucose: 78 mg/dL (ref 70–99)
Potassium: 4 mmol/L (ref 3.5–5.2)
Sodium: 140 mmol/L (ref 134–144)
eGFR: 68 mL/min/1.73 (ref >59)

## 2024-08-11 NOTE — Progress Notes (Signed)
 Internal Medicine Clinic Attending  Case discussed with the resident at the time of the visit.  We reviewed the resident's history and exam and pertinent patient test results.  I agree with the assessment, diagnosis, and plan of care documented in the resident's note.

## 2024-10-17 ENCOUNTER — Ambulatory Visit (INDEPENDENT_AMBULATORY_CARE_PROVIDER_SITE_OTHER): Admitting: Podiatry

## 2024-10-17 ENCOUNTER — Encounter: Payer: Self-pay | Admitting: Podiatry

## 2024-10-17 DIAGNOSIS — M79672 Pain in left foot: Secondary | ICD-10-CM | POA: Diagnosis not present

## 2024-10-17 DIAGNOSIS — M79671 Pain in right foot: Secondary | ICD-10-CM | POA: Diagnosis not present

## 2024-10-17 DIAGNOSIS — B351 Tinea unguium: Secondary | ICD-10-CM | POA: Diagnosis not present

## 2024-10-17 NOTE — Progress Notes (Signed)
 Patient presents for evaluation and treatment of tenderness and some redness around nails feet.  Tenderness around toes with walking and wearing shoes.  Physical exam:  General appearance: Alert, pleasant, and in no acute distress.  Vascular: Pedal pulses: DP 2/4 B/L, PT 0/4 B/L. Moderate edema lower legs bilaterally  Neurological:    Dermatologic:  Nails thickened, disfigured, discolored 1-5 BL with subungual debris.  Redness and hypertrophic nail folds along nail folds bilaterally but no signs of drainage or infection.  Musculoskeletal:     Diagnosis: 1. Painful onychomycotic nails 1 through 5 bilaterally. 2. Pain toes 1 through 5 bilaterally.  Plan: Debrided onychomycotic nails 1 through 5 bilaterally.  Sharply debrided nails with a tissue nipper and reduced with a power bur.  Return 3 months RFC

## 2024-10-30 ENCOUNTER — Other Ambulatory Visit: Payer: Self-pay | Admitting: Student

## 2024-10-30 NOTE — Telephone Encounter (Signed)
 Medication sent to pharmacy

## 2024-11-16 ENCOUNTER — Telehealth: Payer: Self-pay | Admitting: Pharmacist

## 2024-11-16 DIAGNOSIS — E782 Mixed hyperlipidemia: Secondary | ICD-10-CM

## 2024-11-16 NOTE — Progress Notes (Signed)
 Pharmacy Quality Measure Review  This patient is appearing on a report for being at risk of failing the adherence measure for cholesterol (statin) medications this calendar year.   Medication: atorvastatin  20 mg - written to take 2 tablets daily Last fill date: 10/25/2024 for 90 day supply  Recommend to update prescription to atorvastatin  40 mg daily when patient is next due for a refill.   Catie IVAR Centers, PharmD, [facility] Clinical Pharmacist [phone]

## 2024-11-17 MED ORDER — ATORVASTATIN CALCIUM 20 MG PO TABS: 40.0000 mg | ORAL_TABLET | Freq: Every day | ORAL | 3 refills | Status: AC

## 2024-11-17 NOTE — Progress Notes (Signed)
 Noted patient requested atorvastatin  20 mg tablets rather than atorvastatin  40 mg tablets due to difficulty swallowing. Will continue atorvastatin  40 mg (2x 20 mg tablets) daily per patient preference. Noted no refills remaining on rx. Patient not recommended to return to PCP until March 2026. Will refill to avoid issues with adherence next year.   Lorain Baseman, PharmD [facility] [phone]

## 2024-11-17 NOTE — Addendum Note (Signed)
 Addended by: BRINDA LORAIN SQUIBB on: 11/17/2024 11:12 AM   Modules accepted: Orders

## 2024-12-06 ENCOUNTER — Ambulatory Visit

## 2025-01-23 ENCOUNTER — Ambulatory Visit: Admitting: Podiatry
